# Patient Record
Sex: Female | Born: 1975
Health system: Southern US, Community
[De-identification: ages and names within clinical notes are randomized; demographics above are authoritative.]

## PROBLEM LIST (undated history)

## (undated) DIAGNOSIS — D649 Anemia, unspecified: Secondary | ICD-10-CM

## (undated) DIAGNOSIS — E559 Vitamin D deficiency, unspecified: Secondary | ICD-10-CM

---

## 1997-07-04 ENCOUNTER — Other Ambulatory Visit: Admission: RE | Admit: 1997-07-04 | Discharge: 1997-07-04 | Payer: Self-pay | Admitting: Obstetrics

## 1997-07-10 ENCOUNTER — Ambulatory Visit (HOSPITAL_COMMUNITY): Admission: RE | Admit: 1997-07-10 | Discharge: 1997-07-10 | Payer: Self-pay | Admitting: Obstetrics

## 1997-09-20 ENCOUNTER — Ambulatory Visit (HOSPITAL_COMMUNITY): Admission: RE | Admit: 1997-09-20 | Discharge: 1997-09-20 | Payer: Self-pay | Admitting: Obstetrics

## 1997-11-26 ENCOUNTER — Inpatient Hospital Stay (HOSPITAL_COMMUNITY): Admission: AD | Admit: 1997-11-26 | Discharge: 1997-11-26 | Payer: Self-pay | Admitting: Obstetrics

## 1997-12-07 ENCOUNTER — Inpatient Hospital Stay (HOSPITAL_COMMUNITY): Admission: AD | Admit: 1997-12-07 | Discharge: 1997-12-09 | Payer: Self-pay | Admitting: Obstetrics

## 1998-10-06 ENCOUNTER — Other Ambulatory Visit: Admission: RE | Admit: 1998-10-06 | Discharge: 1998-10-06 | Payer: Self-pay | Admitting: Obstetrics

## 1999-11-24 ENCOUNTER — Other Ambulatory Visit: Admission: RE | Admit: 1999-11-24 | Discharge: 1999-11-24 | Payer: Self-pay | Admitting: Obstetrics & Gynecology

## 1999-11-24 ENCOUNTER — Encounter: Admission: RE | Admit: 1999-11-24 | Discharge: 1999-11-24 | Payer: Self-pay | Admitting: Obstetrics & Gynecology

## 2000-05-19 ENCOUNTER — Encounter: Admission: RE | Admit: 2000-05-19 | Discharge: 2000-05-19 | Payer: Self-pay | Admitting: Obstetrics

## 2000-08-29 ENCOUNTER — Encounter: Admission: RE | Admit: 2000-08-29 | Discharge: 2000-08-29 | Payer: Self-pay | Admitting: Internal Medicine

## 2000-11-22 ENCOUNTER — Other Ambulatory Visit: Admission: RE | Admit: 2000-11-22 | Discharge: 2000-11-22 | Payer: Self-pay | Admitting: Internal Medicine

## 2002-03-27 ENCOUNTER — Other Ambulatory Visit: Admission: RE | Admit: 2002-03-27 | Discharge: 2002-03-27 | Payer: Self-pay | Admitting: Obstetrics and Gynecology

## 2002-12-17 ENCOUNTER — Encounter: Admission: RE | Admit: 2002-12-17 | Discharge: 2002-12-17 | Payer: Self-pay | Admitting: Internal Medicine

## 2003-08-27 ENCOUNTER — Encounter: Admission: RE | Admit: 2003-08-27 | Discharge: 2003-08-27 | Payer: Self-pay | Admitting: Internal Medicine

## 2003-09-09 ENCOUNTER — Encounter: Admission: RE | Admit: 2003-09-09 | Discharge: 2003-09-09 | Payer: Self-pay | Admitting: Internal Medicine

## 2004-03-06 ENCOUNTER — Encounter: Admission: RE | Admit: 2004-03-06 | Discharge: 2004-03-06 | Payer: Self-pay | Admitting: Internal Medicine

## 2006-11-16 ENCOUNTER — Encounter: Admission: RE | Admit: 2006-11-16 | Discharge: 2006-11-16 | Payer: Self-pay | Admitting: Internal Medicine

## 2008-07-01 ENCOUNTER — Encounter: Payer: Self-pay | Admitting: Internal Medicine

## 2008-07-01 ENCOUNTER — Ambulatory Visit: Payer: Self-pay | Admitting: *Deleted

## 2008-07-01 DIAGNOSIS — J029 Acute pharyngitis, unspecified: Secondary | ICD-10-CM | POA: Insufficient documentation

## 2008-09-09 ENCOUNTER — Ambulatory Visit: Payer: Self-pay | Admitting: Internal Medicine

## 2008-09-09 DIAGNOSIS — M549 Dorsalgia, unspecified: Secondary | ICD-10-CM | POA: Insufficient documentation

## 2008-09-09 LAB — CONVERTED CEMR LAB
AST: 14 units/L (ref 0–37)
BUN: 10 mg/dL (ref 6–23)
Basophils Absolute: 0 10*3/uL (ref 0.0–0.1)
Basophils Relative: 0 % (ref 0–1)
Calcium: 8.9 mg/dL (ref 8.4–10.5)
Chloride: 108 meq/L (ref 96–112)
Creatinine, Ser: 0.78 mg/dL (ref 0.40–1.20)
Eosinophils Absolute: 0.2 10*3/uL (ref 0.0–0.7)
Eosinophils Relative: 2 % (ref 0–5)
HCT: 33.2 % — ABNORMAL LOW (ref 36.0–46.0)
Lymphs Abs: 1.7 10*3/uL (ref 0.7–4.0)
MCHC: 32.8 g/dL (ref 30.0–36.0)
MCV: 76.8 fL — ABNORMAL LOW (ref >=78.0)
Nitrite: NEGATIVE
Platelets: 302 10*3/uL (ref 150–400)
Protein, ur: NEGATIVE mg/dL
RDW: 16.7 % — ABNORMAL HIGH (ref 11.5–15.5)
Specific Gravity, Urine: 1.029 (ref 1.005–1.0)
Urobilinogen, UA: 1 (ref 0.0–1.0)

## 2010-08-24 ENCOUNTER — Other Ambulatory Visit: Payer: Self-pay | Admitting: Obstetrics and Gynecology

## 2010-08-24 DIAGNOSIS — N644 Mastodynia: Secondary | ICD-10-CM

## 2010-09-11 ENCOUNTER — Ambulatory Visit
Admission: RE | Admit: 2010-09-11 | Discharge: 2010-09-11 | Disposition: A | Discharge status: Home / Self Care | Payer: 59 | Source: Ambulatory Visit | Attending: Obstetrics and Gynecology | Admitting: Obstetrics and Gynecology

## 2010-09-11 DIAGNOSIS — N644 Mastodynia: Secondary | ICD-10-CM

## 2014-07-12 ENCOUNTER — Other Ambulatory Visit: Payer: Self-pay | Admitting: Obstetrics and Gynecology

## 2014-07-26 ENCOUNTER — Inpatient Hospital Stay (HOSPITAL_COMMUNITY)
Admission: AD | Admit: 2014-07-26 | Discharge: 2014-07-26 | Disposition: A | Discharge status: Home / Self Care | Payer: 59 | Source: Ambulatory Visit | Attending: Obstetrics and Gynecology | Admitting: Obstetrics and Gynecology

## 2014-07-26 ENCOUNTER — Encounter (HOSPITAL_COMMUNITY): Payer: Self-pay | Admitting: *Deleted

## 2014-07-26 DIAGNOSIS — D649 Anemia, unspecified: Secondary | ICD-10-CM | POA: Diagnosis present

## 2014-07-26 DIAGNOSIS — E559 Vitamin D deficiency, unspecified: Secondary | ICD-10-CM | POA: Insufficient documentation

## 2014-07-26 HISTORY — DX: Anemia, unspecified: D64.9

## 2014-07-26 HISTORY — DX: Vitamin D deficiency, unspecified: E55.9

## 2014-07-26 MED ORDER — SODIUM CHLORIDE 0.9 % IV SOLN
510.0000 mg | INTRAVENOUS | Status: DC
  Administered 2014-07-26: 510 mg via INTRAVENOUS
  Filled 2014-07-26: qty 17

## 2014-07-26 NOTE — MAU Note (Signed)
Urine in lab 

## 2014-11-25 ENCOUNTER — Other Ambulatory Visit: Payer: Self-pay | Admitting: Obstetrics and Gynecology

## 2015-04-01 ENCOUNTER — Other Ambulatory Visit: Payer: Self-pay | Admitting: Obstetrics and Gynecology

## 2015-04-04 DIAGNOSIS — D509 Iron deficiency anemia, unspecified: Secondary | ICD-10-CM | POA: Diagnosis not present

## 2015-05-19 ENCOUNTER — Other Ambulatory Visit: Payer: Self-pay | Admitting: Obstetrics and Gynecology

## 2015-08-06 ENCOUNTER — Other Ambulatory Visit: Payer: Self-pay | Admitting: Obstetrics and Gynecology

## 2015-08-06 MED FILL — FLUCONAZOLE 150 MG TABLET: 150 | 1 days supply | Qty: 1 | Fill #0

## 2015-08-25 ENCOUNTER — Inpatient Hospital Stay (HOSPITAL_COMMUNITY)
Admission: AD | Admit: 2015-08-25 | Discharge: 2015-08-25 | Disposition: A | Discharge status: Home / Self Care | Payer: 59 | Source: Ambulatory Visit | Attending: Obstetrics and Gynecology | Admitting: Obstetrics and Gynecology

## 2015-08-25 DIAGNOSIS — D508 Other iron deficiency anemias: Secondary | ICD-10-CM | POA: Insufficient documentation

## 2015-08-25 MED ORDER — SODIUM CHLORIDE 0.9 % IV SOLN
510.0000 mg | Freq: Once | INTRAVENOUS | Status: AC
  Administered 2015-08-25: 510 mg via INTRAVENOUS
  Filled 2015-08-25: qty 17

## 2015-08-25 NOTE — MAU Note (Signed)
No adverse effects to med. 

## 2015-11-24 DIAGNOSIS — Z01419 Encounter for gynecological examination (general) (routine) without abnormal findings: Secondary | ICD-10-CM | POA: Diagnosis not present

## 2015-11-24 DIAGNOSIS — Z1151 Encounter for screening for human papillomavirus (HPV): Secondary | ICD-10-CM | POA: Diagnosis not present

## 2015-11-24 DIAGNOSIS — Z6841 Body Mass Index (BMI) 40.0 and over, adult: Secondary | ICD-10-CM | POA: Diagnosis not present

## 2015-11-24 DIAGNOSIS — Z1231 Encounter for screening mammogram for malignant neoplasm of breast: Secondary | ICD-10-CM | POA: Diagnosis not present

## 2016-08-27 ENCOUNTER — Other Ambulatory Visit: Payer: 59 | Admitting: Internal Medicine

## 2016-08-27 DIAGNOSIS — Z13220 Encounter for screening for lipoid disorders: Secondary | ICD-10-CM | POA: Diagnosis not present

## 2016-08-27 DIAGNOSIS — Z1329 Encounter for screening for other suspected endocrine disorder: Secondary | ICD-10-CM

## 2016-08-27 DIAGNOSIS — Z0000 Encounter for general adult medical examination without abnormal findings: Secondary | ICD-10-CM

## 2016-08-27 DIAGNOSIS — Z130 Encounter for screening for diseases of the blood and blood-forming organs and certain disorders involving the immune mechanism: Secondary | ICD-10-CM | POA: Diagnosis not present

## 2016-08-27 DIAGNOSIS — Z1321 Encounter for screening for nutritional disorder: Secondary | ICD-10-CM

## 2016-08-27 LAB — CBC WITH DIFFERENTIAL/PLATELET
BASOS PCT: 1 %
Basophils Absolute: 48 cells/uL (ref 0–200)
EOS PCT: 4 %
Eosinophils Absolute: 192 cells/uL (ref 15–500)
HEMATOCRIT: 27.6 % — AB (ref 35.0–45.0)
Hemoglobin: 7.4 g/dL — ABNORMAL LOW (ref 11.7–15.5)
LYMPHS PCT: 27 %
Lymphs Abs: 1296 cells/uL (ref 850–3900)
MCH: 17.7 pg — ABNORMAL LOW (ref 27.0–33.0)
MCHC: 26.8 g/dL — AB (ref 32.0–36.0)
MCV: 65.9 fL — ABNORMAL LOW (ref 80.0–100.0)
MONO ABS: 336 {cells}/uL (ref 200–950)
MONOS PCT: 7 %
MPV: 8.8 fL (ref 7.5–12.5)
NEUTROS PCT: 61 %
Neutro Abs: 2928 cells/uL (ref 1500–7800)
PLATELETS: 210 10*3/uL (ref 140–400)
RBC: 4.19 MIL/uL (ref 3.80–5.10)
RDW: 17.7 % — AB (ref 11.0–15.0)
WBC: 4.8 10*3/uL (ref 3.8–10.8)

## 2016-08-27 LAB — LIPID PANEL
CHOL/HDL RATIO: 2.6 ratio (ref <5.0)
CHOLESTEROL: 145 mg/dL (ref <200)
HDL: 55 mg/dL (ref >50)
LDL CALC: 82 mg/dL (ref <100)
Triglycerides: 39 mg/dL (ref <150)
VLDL: 8 mg/dL (ref <30)

## 2016-08-27 LAB — COMPLETE METABOLIC PANEL WITH GFR
ALT: 6 U/L (ref 6–29)
AST: 10 U/L (ref 10–30)
Albumin: 3.9 g/dL (ref 3.6–5.1)
Alkaline Phosphatase: 47 U/L (ref 33–115)
BUN: 11 mg/dL (ref 7–25)
CALCIUM: 8.6 mg/dL (ref 8.6–10.2)
CHLORIDE: 108 mmol/L (ref 98–110)
CO2: 21 mmol/L (ref 20–31)
CREATININE: 0.73 mg/dL (ref 0.50–1.10)
GFR, Est African American: >89 mL/min (ref >=60)
GFR, Est Non African American: >89 mL/min (ref >=60)
GLUCOSE: 83 mg/dL (ref 65–99)
POTASSIUM: 4.1 mmol/L (ref 3.5–5.3)
SODIUM: 137 mmol/L (ref 135–146)
Total Bilirubin: 0.5 mg/dL (ref 0.2–1.2)
Total Protein: 6.2 g/dL (ref 6.1–8.1)

## 2016-08-28 LAB — TSH: TSH: 1.81 mIU/L

## 2016-08-28 LAB — VITAMIN D 25 HYDROXY (VIT D DEFICIENCY, FRACTURES): VIT D 25 HYDROXY: 23 ng/mL — AB (ref 30–100)

## 2016-09-03 ENCOUNTER — Encounter: Payer: Self-pay | Admitting: Internal Medicine

## 2016-09-03 ENCOUNTER — Ambulatory Visit (INDEPENDENT_AMBULATORY_CARE_PROVIDER_SITE_OTHER): Payer: 59 | Admitting: Internal Medicine

## 2016-09-03 VITALS — BP 102/70 | HR 75 | Temp 98.2°F | Ht 66.25 in | Wt 245.0 lb

## 2016-09-03 DIAGNOSIS — D649 Anemia, unspecified: Secondary | ICD-10-CM

## 2016-09-03 DIAGNOSIS — D500 Iron deficiency anemia secondary to blood loss (chronic): Secondary | ICD-10-CM | POA: Diagnosis not present

## 2016-09-03 DIAGNOSIS — Z0000 Encounter for general adult medical examination without abnormal findings: Secondary | ICD-10-CM

## 2016-09-03 LAB — POCT URINALYSIS DIPSTICK
BILIRUBIN UA: NEGATIVE
GLUCOSE UA: NEGATIVE
KETONES UA: NEGATIVE
Leukocytes, UA: NEGATIVE
NITRITE UA: NEGATIVE
PH UA: 5 (ref 5.0–8.0)
Protein, UA: NEGATIVE
Spec Grav, UA: 1.02 (ref 1.010–1.025)
Urobilinogen, UA: 0.2 E.U./dL

## 2016-09-03 LAB — IRON AND TIBC
%SAT: 3 % — ABNORMAL LOW (ref 11–50)
Iron: 14 ug/dL — ABNORMAL LOW (ref 40–190)
TIBC: 425 ug/dL (ref 250–450)
UIBC: 411 ug/dL

## 2016-09-04 DIAGNOSIS — D509 Iron deficiency anemia, unspecified: Secondary | ICD-10-CM | POA: Insufficient documentation

## 2016-09-04 LAB — VITAMIN B12: VITAMIN B 12: 373 pg/mL (ref 200–1100)

## 2016-09-04 NOTE — Progress Notes (Signed)
Subjective:    Patient ID: Lori Lara, female    DOB: May 19, 1975, 41 y.o.   MRN: [id number]  HPI Pleasant 41 year old Black Female presents the office for the first time today for health maintenance exam and evaluation of medical issues.  Patient has a history of iron deficiency anemia. GYN physician is Dr. Garwin Brothers. Patient relates that she has a history of uterine fibroid and menorrhagia.  I do not have GYN records at this time   Ultrasound the pelvis in 2008 ( ten years ago) was negative for uterine fibroids and endometrial at that time appeared to be normal.  All records in Epic indicate she had a hemoglobin of 10.9 g in 2010 with an MCV of 76.8.  She now has a hemoglobin of 7.4 g with an MCV of 65.9. RDW is 17.7. White blood cell count is normal.  Iron level checked after we discovered she was anemic is 14. B-12 level is 373. She is vitamin D deficient at 22. Lipid panel and TSH are normal and C met is normal.  Records will try an internal medicine in 2002 indicate she had a normal hemoglobin of 12.8 g. In 2005 she had a hemoglobin of 11.5 g with an MCV of 84.3.  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 She has a high school education and is single. Does not smoke or consume alcohol. She has 2 sons ages 43 and 68.  Family history: Father age 60 in good health. Mother age 7 with history of hypertension and rheumatoid arthritis. One sister 74 in good health. Maternal grandfather with history of MI. Paternal grandmother with history of dementia.  History of constipation and internal hemorrhoids treated with MiraLAX and Anusol in 2014. At that time was on oral iron supplement. Treated for PID in 2011. History of cesarean section.  No known drug allergies.   Review of Systems she has a prior history of headaches and hair loss. Last pelvic exam October 2017. Duration of menses is 5-7 days and interval is 23-25  days. Says Dr. Garwin Brothers has discussed options for managing uterine fibroid and menorrhagia with her previously. Has mammogram through GYN office     Objective:   Physical Exam  Constitutional: She is oriented to person, place, and time. She appears well-developed and well-nourished. No distress.  HENT:  Head: Normocephalic and atraumatic.  Right Ear: External ear normal.  Left Ear: External ear normal.  Mouth/Throat: Oropharynx is clear and moist. No oropharyngeal exudate.  Eyes: Pupils are equal, round, and reactive to light. Conjunctivae and EOM are normal. Left eye exhibits no discharge. No scleral icterus.  Neck: Neck supple. No JVD present. No thyromegaly present.  Cardiovascular: Normal rate, regular rhythm, normal heart sounds and intact distal pulses.   No murmur heard. Pulmonary/Chest: No respiratory distress. She has no wheezes. She has no rales.  Breasts normal female without masses  Abdominal: Soft. Bowel sounds are normal. She exhibits no distension and no mass. There is no tenderness. There is no rebound.  Genitourinary:  Genitourinary Comments: Deferred to GYN  Musculoskeletal: She exhibits no edema.  Lymphadenopathy:    She has no cervical adenopathy.  Neurological: She is alert and oriented to person, place, and time. She has normal reflexes. She displays normal reflexes. No cranial nerve deficit.  Skin: Skin is warm and dry. No rash noted. She is not diaphoretic.  Psychiatric: She has a normal mood and affect. Her behavior is  normal. Judgment and thought content normal.  Vitals reviewed.         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 Phillip Heal for 10 in the near future for consideration of IV iron therapy since she became constipated on oral iron previously and has long-standing history of iron deficiency. She may need to give consideration to options  Dr. Garwin Brothers has discussed with her for management of menorrhagia.  Start oral iron and return in 2 weeks for CBC and reticulocyte count as well as iron and iron-binding capacity.

## 2016-09-04 NOTE — Patient Instructions (Addendum)
It was a pleasure to see you today. We will refer you to hematologist for consideration of IV iron therapy for chronic iron deficiency anemia. Start oral iron and return in 2 weeks for reticulocyte count and repeat CBC.

## 2016-09-15 ENCOUNTER — Encounter: Payer: Self-pay | Admitting: Internal Medicine

## 2016-09-16 ENCOUNTER — Other Ambulatory Visit: Payer: 59 | Admitting: Internal Medicine

## 2016-09-17 ENCOUNTER — Ambulatory Visit: Payer: 59 | Admitting: Internal Medicine

## 2016-09-24 ENCOUNTER — Other Ambulatory Visit: Payer: 59 | Admitting: Internal Medicine

## 2016-09-24 DIAGNOSIS — D509 Iron deficiency anemia, unspecified: Secondary | ICD-10-CM

## 2016-09-24 LAB — CBC WITH DIFFERENTIAL/PLATELET
BASOS PCT: 1 %
Basophils Absolute: 49 cells/uL (ref 0–200)
EOS PCT: 4 %
Eosinophils Absolute: 196 cells/uL (ref 15–500)
HEMATOCRIT: 28.2 % — AB (ref 35.0–45.0)
HEMOGLOBIN: 7.8 g/dL — AB (ref 11.7–15.5)
LYMPHS ABS: 1274 {cells}/uL (ref 850–3900)
Lymphocytes Relative: 26 %
MCH: 19.3 pg — ABNORMAL LOW (ref 27.0–33.0)
MCHC: 27.7 g/dL — AB (ref 32.0–36.0)
MCV: 69.6 fL — ABNORMAL LOW (ref 80.0–100.0)
MONO ABS: 294 {cells}/uL (ref 200–950)
MPV: 9.1 fL (ref 7.5–12.5)
Monocytes Relative: 6 %
NEUTROS ABS: 3087 {cells}/uL (ref 1500–7800)
Neutrophils Relative %: 63 %
PLATELETS: 386 10*3/uL (ref 140–400)
RBC: 4.05 MIL/uL (ref 3.80–5.10)
RDW: 22.4 % — AB (ref 11.0–15.0)
WBC: 4.9 10*3/uL (ref 3.8–10.8)

## 2016-09-25 LAB — IRON AND TIBC
%SAT: 44 % (ref 11–50)
Iron: 173 ug/dL (ref 40–190)
TIBC: 392 ug/dL (ref 250–450)
UIBC: 219 ug/dL

## 2016-09-27 ENCOUNTER — Ambulatory Visit (INDEPENDENT_AMBULATORY_CARE_PROVIDER_SITE_OTHER): Payer: 59 | Admitting: Internal Medicine

## 2016-09-27 ENCOUNTER — Encounter: Payer: Self-pay | Admitting: Internal Medicine

## 2016-09-27 VITALS — BP 100/80 | HR 68 | Temp 98.1°F | Ht 66.25 in | Wt 247.0 lb

## 2016-09-27 DIAGNOSIS — D649 Anemia, unspecified: Secondary | ICD-10-CM

## 2016-09-27 LAB — RETICULOCYTES
ABS RETIC: 60450 {cells}/uL (ref 20000–80000)
RBC.: 4.03 MIL/uL (ref 3.80–5.10)
Retic Ct Pct: 1.5 %

## 2016-09-27 NOTE — Patient Instructions (Signed)
Additional anemia studies drawn and pending. Please keep appointment with hematologist in September. Take iron once daily and may take MiraLAX for constipation.

## 2016-09-27 NOTE — Progress Notes (Signed)
   Subjective:    Patient ID: Lori Lara, female    DOB: 1975/09/10, 41 y.o.   MRN: [id number]  HPI 41 year old Black Female for follow up of anemia.    Periods are heavy day 2 or 3  of cycle but does not consider it to be excessively heavy.   She was seen here for the first time July 27. Hemoglobin at that time was 7.4 g with an MCV of 65.9. The last CBC on file in Epic was 8 years ago and hemoglobin was 10.9 g with an MCV of 76.8.  After iron therapy for about a month, hemoglobin is still low at 7.8 g and MCV is 69.6. I would expect better improvement with iron therapy. Platelet counts are normal. White blood cell counts are normal.  Perhaps she has thalassemia.   Started out taking iron sulfate twice daily but caused constipation. Now just taking once daily. May take MiraLAX for constipation associated with iron therapy.    Review of Systems see above  She has appointment with Dr. Beryle Beams in September     Objective:   Physical Exam  Not examined. Spent 15 minutes speaking with her about these issues.      Assessment & Plan:  Iron deficiency is corrected with oral iron therapy having improved from 14 to 173  Persistent anemia and microcytosis? Thalassemia  Her reticulocyte count is 1.5%. B-12 and folate are also being checked. Asked patient to please keep appointment with Dr. Beryle Beams. May continue take iron once daily for the present time.

## 2016-09-28 LAB — VITAMIN B12: Vitamin B-12: 365 pg/mL (ref 200–1100)

## 2016-09-28 LAB — FERRITIN: FERRITIN: 5 ng/mL — AB (ref 10–232)

## 2016-09-28 LAB — FOLATE: Folate: 7.7 ng/mL (ref >5.4)

## 2016-10-25 ENCOUNTER — Ambulatory Visit (INDEPENDENT_AMBULATORY_CARE_PROVIDER_SITE_OTHER): Payer: 59 | Admitting: Oncology

## 2016-10-25 ENCOUNTER — Encounter: Payer: Self-pay | Admitting: Oncology

## 2016-10-25 ENCOUNTER — Encounter: Payer: 59 | Admitting: Oncology

## 2016-10-25 VITALS — BP 135/83 | HR 72 | Temp 97.9°F | Ht 66.0 in | Wt 249.3 lb

## 2016-10-25 DIAGNOSIS — D509 Iron deficiency anemia, unspecified: Secondary | ICD-10-CM | POA: Diagnosis not present

## 2016-10-25 LAB — SAVE SMEAR

## 2016-10-25 MED ORDER — POLYSACCHARIDE IRON COMPLEX 150 MG PO CAPS: 150.0000 mg | ORAL_CAPSULE | Freq: Every day | ORAL | 3 refills | Status: DC

## 2016-10-25 NOTE — Patient Instructions (Addendum)
Change iron preparation to Niferex 150 mg 1 capsule daily To lab today Come back for repeat lab in 2 months on 11/19

## 2016-10-25 NOTE — Progress Notes (Signed)
New Patient Hematology   Lori Lara [id number] Sep 07, 1975 41 y.o. 10/25/2016  CC: Dr. 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 She has long-standing anemia related to menorrhagia and uterine fibroids. She has received iron infusions in the past in June 2016 and again in July 2017 through her OB/GYN's office. She did not take any oral iron supplements in between or since. She recently established her primary care with Dr. Renold Genta. Routine lab done on 08/27/2016 with hemoglobin 7.4, hematocrit 28, MCV 66, white count 4800, 61% neutrophils, 27 lymphocytes, 7 monocytes, 4 eosinophils, 1 basophil. Normal chemistry profile. Iron studies done on July 27 with serum are 14, TIBC 425, ferritin not done at that time. She has been on iron sulfate 325 mg twice daily. Labs were repeated on August 17. Serum iron 173, percent saturation 44, ferritin done 3 days later was 5. She started taking the iron at the time of the July 27 visit initially twice daily but due to significant constipation she decrease to once a day and hasn't taken any for the last week. She is under ongoing evaluation for the menorrhagia associated with her fibroids and a endometrial ablation procedure has been recommended. She has never had any hematochezia or hematuria. Stools are predictably dark on the iron. She has no constitutional symptoms except for fatigue and lightheadedness which have improved since she started taking the iron. She has no history of peptic ulcer disease or reflux. She does not use alcohol. She rarely uses nonsteroidals. She has no other medical or surgical illness. She is on no chronic medications except for the iron. No allergies. She had a C-section with her first child who is now 62. Second child by vaginal delivery who is now 56. Both boys. She has a 3 year old sister who also had severe anemia with a hemoglobin  down to 4 requiring transfusion. Her mother had lifelong anemia and had to have a hysterectomy. She is now 32. Father is 53 and healthy.   PMH: Past Medical History:  Diagnosis Date  . Anemia   . Vitamin D deficiency      past surgical historyC-section with first child:  Allergies: No Known Allergies  Medications:  Current Outpatient Prescriptions:  .  ibuprofen (ADVIL,MOTRIN) 200 MG tablet, Take 600 mg by mouth every 6 (six) hours as needed for moderate pain., Disp: , Rfl:  .  iron polysaccharides (NIFEREX) 150 MG capsule, Take 1 capsule (150 mg total) by mouth daily., Disp: 90 capsule, Rfl: 3  Social History: Married. 2 healthy sons. tshe has never smoked. . She does not drink alcohol or use drugs.  Family History: See history of present illness  Review of Systems: See HPI Remaining ROS negative.  Physical Exam: Blood pressure 135/83, pulse 72, temperature 97.9 F (36.6 C), height 5\' 6"  (1.676 m), weight 249 lb 4.8 oz (113.1 kg), SpO2 100 %. Wt Readings from Last 3 Encounters:  10/25/16 249 lb 4.8 oz (113.1 kg)  09/27/16 247 lb (112 kg)  09/03/16 245 lb (111.1 kg)     General appearance: Well-nourished African-American woman HENNT: Pharynx no erythema, exudate, mass, or ulcer. No thyromegaly or thyroid nodules Lymph nodes: No cervical, supraclavicular, or axillary lymphadenopathy Breasts:  Lungs: Clear to auscultation, resonant to percussion throughout Heart: Regular rhythm, no murmur, no gallop, no rub, no click, no edema Abdomen: Soft, nontender, normal bowel sounds, no mass, no organomegaly Extremities: No  edema, no calf tenderness Musculoskeletal: no joint deformities GU:  Vascular: Carotid pulses 2+,  Neurologic: Alert, oriented, PERRLA, optic discs sharp and vessels normal, no hemorrhage or exudate, cranial nerves grossly normal, motor strength 5 over 5, reflexes 1+ symmetric, upper body coordination normal, gait normal, Skin: No rash or  ecchymosis    Lab Results: Lab Results  Component Value Date   WBC 4.9 09/24/2016   HGB 7.8 (L) 09/24/2016   HCT 28.2 (L) 09/24/2016   MCV 69.6 (L) 09/24/2016   PLT 386 09/24/2016     Chemistry      Component Value Date/Time   NA 137 08/27/2016 1052   K 4.1 08/27/2016 1052   CL 108 08/27/2016 1052   CO2 21 08/27/2016 1052   BUN 11 08/27/2016 1052   CREATININE 0.73 08/27/2016 1052      Component Value Date/Time   CALCIUM 8.6 08/27/2016 1052   ALKPHOS 47 08/27/2016 1052   AST 10 08/27/2016 1052   ALT 6 08/27/2016 1052   BILITOT 0.5 08/27/2016 1052    Hemoglobin today: 8.5 MCV 70 We take count 1.5% on August 20, absolute 60,000 (20,000-80,000)  Review of peripheral blood film:  Hypochromic microcytic red cells.  1+ aniso poikilocytosis.  Subpopulation of elliptocytes.  No polychromasia.  Mature neutrophils and lymphocytes.  Platelets normal in number and morphology with some large platelets.   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  This can be a nonspecific finding with iron deficiency.  It would be difficult to exclude mild hereditary elliptocytosis without further studies of membrane proteins (spectrin).  There appears to be no sign of a coexisting hemolytic process with bilirubin 0.5 and reticulocyte count 1.5%. I think she was just not on the oral iron long enough to replete bone marrow stores. I'm going to change her to Niferex 150 mg ferrous gluconate preparation which may be better tolerated with respect to the constipation she was experiencing. I will check a CBC today for a starting point but not repeat another CBC for 2 months. She is considering an endometrial ablation procedure and this might help significantly with respect to chronic blood loss.    Recommendation: See above   Lori Hopper, MD, [place]  Hematology-Oncology/Internal Medicine  10/25/2016, 2:59 PM

## 2016-10-26 LAB — CBC WITH DIFFERENTIAL/PLATELET
Basophils Absolute: 0.1 10*3/uL (ref 0.0–0.2)
Basos: 1 %
EOS (ABSOLUTE): 0.1 10*3/uL (ref 0.0–0.4)
EOS: 2 %
HEMATOCRIT: 28.9 % — AB (ref 34.0–46.6)
HEMOGLOBIN: 8.5 g/dL — AB (ref 11.1–15.9)
Immature Grans (Abs): 0 10*3/uL (ref 0.0–0.1)
Immature Granulocytes: 0 %
LYMPHS ABS: 1.4 10*3/uL (ref 0.7–3.1)
Lymphs: 28 %
MCH: 20.5 pg — ABNORMAL LOW (ref 26.6–33.0)
MCHC: 29.4 g/dL — AB (ref 31.5–35.7)
MCV: 70 fL — ABNORMAL LOW (ref 79–97)
MONOCYTES: 7 %
MONOS ABS: 0.3 10*3/uL (ref 0.1–0.9)
NEUTROS ABS: 3.2 10*3/uL (ref 1.4–7.0)
Neutrophils: 62 %
PLATELETS: 417 10*3/uL — AB (ref 150–379)
RBC: 4.15 x10E6/uL (ref 3.77–5.28)
RDW: 20.7 % — AB (ref 12.3–15.4)
WBC: 5.1 10*3/uL (ref 3.4–10.8)

## 2016-10-26 MED FILL — POLY-IRON 150 MG CAPSULE: 150 | 60 days supply | Qty: 120 | Fill #0

## 2016-10-28 NOTE — Progress Notes (Signed)
Thanks so much. 

## 2016-12-27 ENCOUNTER — Other Ambulatory Visit (INDEPENDENT_AMBULATORY_CARE_PROVIDER_SITE_OTHER): Payer: 59

## 2016-12-27 DIAGNOSIS — D509 Iron deficiency anemia, unspecified: Secondary | ICD-10-CM

## 2016-12-28 ENCOUNTER — Other Ambulatory Visit: Payer: Self-pay | Admitting: Oncology

## 2016-12-28 ENCOUNTER — Telehealth: Payer: Self-pay | Admitting: *Deleted

## 2016-12-28 DIAGNOSIS — K909 Intestinal malabsorption, unspecified: Secondary | ICD-10-CM

## 2016-12-28 DIAGNOSIS — D508 Other iron deficiency anemias: Secondary | ICD-10-CM

## 2016-12-28 LAB — CBC WITH DIFFERENTIAL/PLATELET
BASOS: 1 %
Basophils Absolute: 0.1 10*3/uL (ref 0.0–0.2)
EOS (ABSOLUTE): 0.1 10*3/uL (ref 0.0–0.4)
EOS: 2 %
HEMATOCRIT: 32.1 % — AB (ref 34.0–46.6)
HEMOGLOBIN: 9.4 g/dL — AB (ref 11.1–15.9)
Immature Grans (Abs): 0 10*3/uL (ref 0.0–0.1)
Immature Granulocytes: 0 %
LYMPHS ABS: 2 10*3/uL (ref 0.7–3.1)
Lymphs: 29 %
MCH: 20.8 pg — ABNORMAL LOW (ref 26.6–33.0)
MCHC: 29.3 g/dL — AB (ref 31.5–35.7)
MCV: 71 fL — AB (ref 79–97)
MONOCYTES: 6 %
MONOS ABS: 0.4 10*3/uL (ref 0.1–0.9)
NEUTROS ABS: 4.2 10*3/uL (ref 1.4–7.0)
NEUTROS PCT: 62 %
Platelets: 305 10*3/uL (ref 150–379)
RBC: 4.52 x10E6/uL (ref 3.77–5.28)
RDW: 17.4 % — AB (ref 12.3–15.4)
WBC: 6.8 10*3/uL (ref 3.4–10.8)

## 2016-12-28 LAB — FERRITIN: Ferritin: 5 ng/mL — ABNORMAL LOW (ref 15–150)

## 2016-12-28 NOTE — Telephone Encounter (Signed)
Changed to 2PM on 12/7 per pt's request.

## 2016-12-28 NOTE — Telephone Encounter (Signed)
-----   Message from [name], MD sent at 12/28/2016  1:44 PM EST ----- Lori Lara - I put in orders to start IV iron on Thurs 11/29. Weekly x 2. Please get her an appt & let her know. Thx DrG

## 2016-12-28 NOTE — Telephone Encounter (Signed)
thanks

## 2016-12-28 NOTE — Telephone Encounter (Signed)
Pt stated she wanted Friday morning appt. Appt scheduled 12/7 @ 0800 AM here at [place]; informed to register at [facility] and schedule next appt before she leaves w/[facility]. Voiced understanding.

## 2017-01-14 ENCOUNTER — Ambulatory Visit (HOSPITAL_COMMUNITY)
Admission: RE | Admit: 2017-01-14 | Discharge: 2017-01-14 | Disposition: A | Discharge status: Home / Self Care | Payer: 59 | Source: Ambulatory Visit | Attending: Oncology | Admitting: Oncology

## 2017-01-14 DIAGNOSIS — K909 Intestinal malabsorption, unspecified: Secondary | ICD-10-CM | POA: Insufficient documentation

## 2017-01-14 DIAGNOSIS — D508 Other iron deficiency anemias: Secondary | ICD-10-CM | POA: Insufficient documentation

## 2017-01-14 MED ORDER — FERUMOXYTOL INJECTION 510 MG/17 ML
510.0000 mg | INTRAVENOUS | Status: DC
  Administered 2017-01-14: 510 mg via INTRAVENOUS
  Filled 2017-01-14: qty 17

## 2017-01-21 ENCOUNTER — Ambulatory Visit (HOSPITAL_COMMUNITY)
Admission: RE | Admit: 2017-01-21 | Discharge: 2017-01-21 | Disposition: A | Discharge status: Home / Self Care | Payer: 59 | Source: Ambulatory Visit | Attending: Oncology | Admitting: Oncology

## 2017-01-21 DIAGNOSIS — K909 Intestinal malabsorption, unspecified: Secondary | ICD-10-CM | POA: Diagnosis not present

## 2017-01-21 DIAGNOSIS — D508 Other iron deficiency anemias: Secondary | ICD-10-CM | POA: Insufficient documentation

## 2017-01-21 MED ORDER — SODIUM CHLORIDE 0.9 % IV SOLN
510.0000 mg | INTRAVENOUS | Status: AC
  Administered 2017-01-21: 08:00:00 510 mg via INTRAVENOUS
  Filled 2017-01-21: qty 17

## 2017-02-15 ENCOUNTER — Encounter: Payer: Self-pay | Admitting: Internal Medicine

## 2017-02-15 DIAGNOSIS — Z1151 Encounter for screening for human papillomavirus (HPV): Secondary | ICD-10-CM | POA: Diagnosis not present

## 2017-02-15 DIAGNOSIS — Z01419 Encounter for gynecological examination (general) (routine) without abnormal findings: Secondary | ICD-10-CM | POA: Diagnosis not present

## 2017-02-15 DIAGNOSIS — Z6841 Body Mass Index (BMI) 40.0 and over, adult: Secondary | ICD-10-CM | POA: Diagnosis not present

## 2017-02-15 DIAGNOSIS — R87810 Cervical high risk human papillomavirus (HPV) DNA test positive: Secondary | ICD-10-CM | POA: Diagnosis not present

## 2017-02-15 DIAGNOSIS — Z1231 Encounter for screening mammogram for malignant neoplasm of breast: Secondary | ICD-10-CM | POA: Diagnosis not present

## 2018-04-07 DIAGNOSIS — Z01419 Encounter for gynecological examination (general) (routine) without abnormal findings: Secondary | ICD-10-CM | POA: Diagnosis not present

## 2018-04-07 DIAGNOSIS — R87810 Cervical high risk human papillomavirus (HPV) DNA test positive: Secondary | ICD-10-CM | POA: Diagnosis not present

## 2018-04-07 DIAGNOSIS — Z6841 Body Mass Index (BMI) 40.0 and over, adult: Secondary | ICD-10-CM | POA: Diagnosis not present

## 2018-04-07 DIAGNOSIS — Z1231 Encounter for screening mammogram for malignant neoplasm of breast: Secondary | ICD-10-CM | POA: Diagnosis not present

## 2018-04-07 DIAGNOSIS — Z1151 Encounter for screening for human papillomavirus (HPV): Secondary | ICD-10-CM | POA: Diagnosis not present

## 2018-09-07 ENCOUNTER — Encounter: Payer: Self-pay | Admitting: Internal Medicine

## 2018-09-07 ENCOUNTER — Ambulatory Visit: Payer: 59 | Admitting: Internal Medicine

## 2018-09-07 ENCOUNTER — Other Ambulatory Visit: Payer: Self-pay

## 2018-09-07 VITALS — BP 110/80 | HR 65 | Temp 97.9°F | Ht 66.0 in | Wt 256.0 lb

## 2018-09-07 DIAGNOSIS — D500 Iron deficiency anemia secondary to blood loss (chronic): Secondary | ICD-10-CM

## 2018-09-07 DIAGNOSIS — D509 Iron deficiency anemia, unspecified: Secondary | ICD-10-CM | POA: Diagnosis not present

## 2018-09-07 DIAGNOSIS — Z8742 Personal history of other diseases of the female genital tract: Secondary | ICD-10-CM | POA: Diagnosis not present

## 2018-09-07 NOTE — Progress Notes (Signed)
   Subjective:    Patient ID: Lori Lara, female    DOB: 11-07-1975, 43 y.o.   MRN: [id number]  HPI Pleasant 43 year old Female with history of iron deficiency anemia treated with Dr. Beryle Beams in 2018.  She initially presented to this office July 2018.  Patient says she has history of uterine fibroid and menorrhagia.  In 2018 had hemoglobin of 7.4 g with an MCV of 65.9.  White blood cell count was normal.  Ferritin was 5.  Iron level was 14 and iron binding capacity was 425.  Right oral iron supplementation as it caused significant constipation.  She was subsequently referred to Dr. Beryle Beams in September 2018.  Was given Feraheme in December 2018.  Has not been taking iron supplementation regularly and is beginning to feel like she is anemic again.  CBC reveals hemoglobin 10.4 g with an MCV of 79.1.  Iron level is 17 with iron binding capacity of 383 and ferritin of 5.  Review of Systems     Objective:   Physical Exam Blood pressure 110/80, weight 256 pounds, BMI 41.32, pulse 65, respiratory rate normal, pulse oximetry 95%.  Neck is supple without adenopathy.  Chest clear to auscultation.  Cardiac exam regular rate and rhythm normal S1 and S2.  Extremities without pitting edema.       Assessment & Plan:  Iron deficiency anemia secondary to menorrhagia and fibroid  Intolerance of oral iron supplementation  Plan: Refer back to hematology for consideration of IV iron treatment.  She will have physical exam here in the near future.

## 2018-09-08 ENCOUNTER — Other Ambulatory Visit: Payer: Self-pay | Admitting: Internal Medicine

## 2018-09-08 DIAGNOSIS — E611 Iron deficiency: Secondary | ICD-10-CM

## 2018-09-08 LAB — CBC WITH DIFFERENTIAL/PLATELET
Absolute Monocytes: 476 cells/uL (ref 200–950)
Basophils Absolute: 80 cells/uL (ref 0–200)
Basophils Relative: 1.2 %
Eosinophils Absolute: 114 cells/uL (ref 15–500)
Eosinophils Relative: 1.7 %
HCT: 33.6 % — ABNORMAL LOW (ref 35.0–45.0)
Hemoglobin: 10.4 g/dL — ABNORMAL LOW (ref 11.7–15.5)
Lymphs Abs: 2044 cells/uL (ref 850–3900)
MCH: 24.5 pg — ABNORMAL LOW (ref 27.0–33.0)
MCHC: 31 g/dL — ABNORMAL LOW (ref 32.0–36.0)
MCV: 79.1 fL — ABNORMAL LOW (ref 80.0–100.0)
MPV: 11.3 fL (ref 7.5–12.5)
Monocytes Relative: 7.1 %
Neutro Abs: 3987 cells/uL (ref 1500–7800)
Neutrophils Relative %: 59.5 %
Platelets: 329 10*3/uL (ref 140–400)
RBC: 4.25 10*6/uL (ref 3.80–5.10)
RDW: 15.1 % — ABNORMAL HIGH (ref 11.0–15.0)
Total Lymphocyte: 30.5 %
WBC: 6.7 10*3/uL (ref 3.8–10.8)

## 2018-09-08 LAB — IRON,TIBC AND FERRITIN PANEL
%SAT: 4 % (calc) — ABNORMAL LOW (ref 16–45)
Ferritin: 5 ng/mL — ABNORMAL LOW (ref 16–232)
Iron: 17 ug/dL — ABNORMAL LOW (ref 40–190)
TIBC: 383 mcg/dL (calc) (ref 250–450)

## 2018-09-08 NOTE — Patient Instructions (Signed)
You are indeed iron deficient once again and will be referred to Hematology for consideration of IV iron treatment

## 2018-09-25 ENCOUNTER — Telehealth: Payer: Self-pay | Admitting: Oncology

## 2018-09-25 NOTE — Telephone Encounter (Signed)
Received anew hem referral from Dr. Renold Genta for Lori Lara. Lori Lara has been cld and scheduled to see Dr. Alen Blew on 9/18 at 2pm.

## 2018-10-12 ENCOUNTER — Other Ambulatory Visit: Payer: Self-pay

## 2018-10-12 ENCOUNTER — Other Ambulatory Visit: Payer: 59 | Admitting: Internal Medicine

## 2018-10-12 DIAGNOSIS — Z1329 Encounter for screening for other suspected endocrine disorder: Secondary | ICD-10-CM

## 2018-10-12 DIAGNOSIS — E611 Iron deficiency: Secondary | ICD-10-CM | POA: Diagnosis not present

## 2018-10-12 DIAGNOSIS — E559 Vitamin D deficiency, unspecified: Secondary | ICD-10-CM | POA: Diagnosis not present

## 2018-10-12 DIAGNOSIS — Z13220 Encounter for screening for lipoid disorders: Secondary | ICD-10-CM | POA: Diagnosis not present

## 2018-10-12 DIAGNOSIS — Z0000 Encounter for general adult medical examination without abnormal findings: Secondary | ICD-10-CM

## 2018-10-13 LAB — CBC WITH DIFFERENTIAL/PLATELET
Absolute Monocytes: 502 cells/uL (ref 200–950)
Basophils Absolute: 73 cells/uL (ref 0–200)
Basophils Relative: 1.1 %
Eosinophils Absolute: 132 cells/uL (ref 15–500)
Eosinophils Relative: 2 %
HCT: 33.6 % — ABNORMAL LOW (ref 35.0–45.0)
Hemoglobin: 10.2 g/dL — ABNORMAL LOW (ref 11.7–15.5)
Lymphs Abs: 1940 cells/uL (ref 850–3900)
MCH: 24 pg — ABNORMAL LOW (ref 27.0–33.0)
MCHC: 30.4 g/dL — ABNORMAL LOW (ref 32.0–36.0)
MCV: 79.1 fL — ABNORMAL LOW (ref 80.0–100.0)
MPV: 10.4 fL (ref 7.5–12.5)
Monocytes Relative: 7.6 %
Neutro Abs: 3953 cells/uL (ref 1500–7800)
Neutrophils Relative %: 59.9 %
Platelets: 384 10*3/uL (ref 140–400)
RBC: 4.25 10*6/uL (ref 3.80–5.10)
RDW: 14.5 % (ref 11.0–15.0)
Total Lymphocyte: 29.4 %
WBC: 6.6 10*3/uL (ref 3.8–10.8)

## 2018-10-13 LAB — LIPID PANEL
Cholesterol: 171 mg/dL (ref <200)
HDL: 62 mg/dL (ref >=50)
LDL Cholesterol (Calc): 95 mg/dL (calc)
Non-HDL Cholesterol (Calc): 109 mg/dL (calc) (ref <130)
Total CHOL/HDL Ratio: 2.8 (calc) (ref <5.0)
Triglycerides: 57 mg/dL (ref <150)

## 2018-10-13 LAB — COMPLETE METABOLIC PANEL WITH GFR
AG Ratio: 2 (calc) (ref 1.0–2.5)
ALT: 10 U/L (ref 6–29)
AST: 14 U/L (ref 10–30)
Albumin: 4.3 g/dL (ref 3.6–5.1)
Alkaline phosphatase (APISO): 49 U/L (ref 31–125)
BUN: 10 mg/dL (ref 7–25)
CO2: 24 mmol/L (ref 20–32)
Calcium: 9.2 mg/dL (ref 8.6–10.2)
Chloride: 105 mmol/L (ref 98–110)
Creat: 0.79 mg/dL (ref 0.50–1.10)
GFR, Est African American: 106 mL/min/{1.73_m2} (ref >=60)
GFR, Est Non African American: 92 mL/min/{1.73_m2} (ref >=60)
Globulin: 2.2 g/dL (calc) (ref 1.9–3.7)
Glucose, Bld: 75 mg/dL (ref 65–99)
Potassium: 4.2 mmol/L (ref 3.5–5.3)
Sodium: 138 mmol/L (ref 135–146)
Total Bilirubin: 0.6 mg/dL (ref 0.2–1.2)
Total Protein: 6.5 g/dL (ref 6.1–8.1)

## 2018-10-13 LAB — VITAMIN D 25 HYDROXY (VIT D DEFICIENCY, FRACTURES): Vit D, 25-Hydroxy: 17 ng/mL — ABNORMAL LOW (ref 30–100)

## 2018-10-13 LAB — TSH: TSH: 1.97 mIU/L

## 2018-10-18 ENCOUNTER — Encounter: Payer: Self-pay | Admitting: Internal Medicine

## 2018-10-18 ENCOUNTER — Other Ambulatory Visit: Payer: Self-pay

## 2018-10-18 ENCOUNTER — Ambulatory Visit (INDEPENDENT_AMBULATORY_CARE_PROVIDER_SITE_OTHER): Payer: 59 | Admitting: Internal Medicine

## 2018-10-18 VITALS — BP 102/70 | HR 86 | Temp 98.0°F | Ht 66.0 in | Wt 258.0 lb

## 2018-10-18 DIAGNOSIS — Z0000 Encounter for general adult medical examination without abnormal findings: Secondary | ICD-10-CM

## 2018-10-18 DIAGNOSIS — D500 Iron deficiency anemia secondary to blood loss (chronic): Secondary | ICD-10-CM | POA: Diagnosis not present

## 2018-10-18 DIAGNOSIS — E559 Vitamin D deficiency, unspecified: Secondary | ICD-10-CM

## 2018-10-18 DIAGNOSIS — Z8742 Personal history of other diseases of the female genital tract: Secondary | ICD-10-CM | POA: Diagnosis not present

## 2018-10-18 LAB — POCT URINALYSIS DIPSTICK
Appearance: NEGATIVE
Bilirubin, UA: NEGATIVE
Blood, UA: NEGATIVE
Glucose, UA: NEGATIVE
Ketones, UA: NEGATIVE
Leukocytes, UA: NEGATIVE
Odor: NEGATIVE
Protein, UA: NEGATIVE
Spec Grav, UA: 1.01 (ref 1.010–1.025)
Urobilinogen, UA: 0.2 E.U./dL
pH, UA: 6.5 (ref 5.0–8.0)

## 2018-10-18 MED ORDER — ERGOCALCIFEROL 1.25 MG (50000 UT) PO CAPS: 50000.0000 [IU] | ORAL_CAPSULE | ORAL | 3 refills | Status: DC

## 2018-10-18 MED FILL — VIT D2 1.25 MG (50,000 UNIT: 1.25 MG | 84 days supply | Qty: 12 | Fill #0

## 2018-10-18 NOTE — Patient Instructions (Addendum)
It was a pleasure to see you today. Continue oral iron and see Hematologist about IV iron infusion. I think hysterectomy is a good plan in the near future. Take Drisdol 50,000 units weekly. Have flu vaccine at work and call employee health to check on your tetanus immunization date.  Have vitamin D level checked here in 6 months otherwise return in 1 year or as needed.

## 2018-10-18 NOTE — Progress Notes (Signed)
   Subjective:    Patient ID: Lori Lara, female    DOB: 07/24/1975, 43 y.o.   MRN: [id number]  HPI 43 year old Female for health maintenance exam and evaluation of medical issues.  Initially seen for the first time July 2018.  History of iron deficiency anemia.  Previously saw Dr. Beryle Beams in 2018 for iron deficiency anemia not responding to oral iron and received to be iron injections.  Now his hemoglobin 10.4 g with MCV 79.1.  Total iron is 17 and ferritin is 5.  TSH and lipid panel are both normal.  Vitamin D level is low at 17 and she will be treated with high-dose vitamin D.  Records at [facility] in 2002 indicated she had a normal hemoglobin of 12.8 g.  In 2005 she had a hemoglobin of 11.5 g with an MCV of 84.3.  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  She has a high school education and is single.  Does not smoke or consume alcohol.  She has 2 sons ages 85 and 58.  She has history of constipation and internal hemorrhoids treated with MiraLAX and Anusol in 2014.  At that time was on oral iron supplement.  Treated for PID in 2011.  History of Cesarean section.  No known drug allergies.  Family history: Father age 28 in good health.  Mother age 25 with history of hypertension and rheumatoid arthritis.  One sister age 55 in good health.  Maternal grandfather with history of MI.  Paternal grandmother with history of dementia.         Review of Systems  Constitutional: Positive for fatigue.  Respiratory: Negative.   Cardiovascular: Negative.   Genitourinary:       History of menorrhagia  Psychiatric/Behavioral: Negative.   -- menorrhagia followed by Dr. Garwin Brothers. Considering hysterectomy after pandemic under control. Does not take Vitamin D supplement.     Objective:   Physical Exam Pleasant female in no acute distress blood pressure 102/70 pulse 86 regular temperature 98  degrees orally pulse oximetry 97% weight 258 pounds BMI 41.64 height 5 feet 6 inches  Skin warm and dry.  Nodes none.  Neck is supple without JVD thyromegaly or carotid bruits.  TMs and pharynx are clear.  Breasts normal female without masses.  Cardiac exam regular rate and rhythm normal S1 and S2 without murmurs.  Abdomen soft nondistended without hepatosplenomegaly masses or tenderness.  Genitourinary exam deferred to Dr. cousins  No deformity of the lower extremities.  Neurological exam she is alert and oriented x3 with no focal deficits.       Assessment & Plan:  Iron deficiency anemia.  History of constipation with oral iron therapy.  Refer to hematology for IV iron infusions.  Needs to consider ablation or hysterectomy after pandemic under control per GYN physician.  Vitamin D deficiency-will be treated with Drisdol 50,000 units weekly as she has trouble remembering to take daily vitamin D supplementation.  Would like to check vitamin D level again in 6 months.  Hematology referral will be made.  To have flu vaccine through employment.  Patient is to call employee health to check on date of tetanus immunization.

## 2018-10-27 ENCOUNTER — Other Ambulatory Visit: Payer: Self-pay

## 2018-10-27 ENCOUNTER — Inpatient Hospital Stay: Payer: 59 | Attending: Oncology | Admitting: Oncology

## 2018-10-27 VITALS — BP 131/83 | HR 72 | Temp 98.3°F | Resp 17 | Ht 66.0 in | Wt 262.0 lb

## 2018-10-27 DIAGNOSIS — D509 Iron deficiency anemia, unspecified: Secondary | ICD-10-CM

## 2018-10-27 DIAGNOSIS — N920 Excessive and frequent menstruation with regular cycle: Secondary | ICD-10-CM | POA: Insufficient documentation

## 2018-10-27 DIAGNOSIS — D500 Iron deficiency anemia secondary to blood loss (chronic): Secondary | ICD-10-CM | POA: Insufficient documentation

## 2018-10-27 NOTE — Progress Notes (Signed)
Hematology and Oncology Follow Up Visit  Lori Lara [id number] December 30, 1975 43 y.o. 10/27/2018 2:38 PM Baxley, Lori Lara, MDBaxley, Lori Lick, MD   Principle Diagnosis: 43 year old woman with iron deficiency anemia diagnosed in July 2018.  He presented with a hemoglobin of 7.4, iron of 14 with saturation of 3%.  Her iron deficiency is related to menstrual blood losses and menorrhagia.   Prior Therapy:  Oral iron therapy that has been unsuccessful in the past.  She is status post Feraheme infusion in December 2018.  Current therapy: Oral iron therapy.  Interim History: [name] presents today for a follow-up visit.  He is a pleasant 43 year old woman evaluated by Dr. Beryle Beams in the past for iron deficiency anemia and was treated with intravenous iron in December 2018.  She had a physical with her primary care physician in July 2020 and iron studies at that time showed iron level of 17 and ferritin of 5.  Her hemoglobin at the time was 10.4.  Repeat CBC after taking oral iron continues to show hemoglobin of 10.2 with MCV of 79.  He does report some mild fatigue and tiredness but for the most part she does not report any chest pain palpitation or orthopnea.  He does have menorrhagia related to uterine fibroids which has not changed.  She does not report any headaches, blurry vision, syncope or seizures. Does not report any fevers, chills or sweats.  Does not report any cough, wheezing or hemoptysis.  Does not report any chest pain, palpitation, orthopnea or leg edema.  Does not report any nausea, vomiting or abdominal pain.  Does not report any constipation or diarrhea.  Does not report any skeletal complaints.    Does not report frequency, urgency or hematuria.  Does not report any skin rashes or lesions. Does not report any heat or cold intolerance.  Does not report any lymphadenopathy or petechiae.  Does not report any anxiety or depression.  Remaining review of systems is negative.     Medications: I have reviewed the patient's current medications.  Current Outpatient Medications  Medication Sig Dispense Refill  . ergocalciferol (DRISDOL) 1.25 MG (50000 UT) capsule Take 1 capsule (50,000 Units total) by mouth once a week. 12 capsule 3  . ferrous sulfate 220 (44 Fe) MG/5ML solution Take 220 mg by mouth daily with breakfast.     No current facility-administered medications for this visit.      Allergies: No Known Allergies  Past Medical History, Surgical history, Social history, and Family History were reviewed and updated.    Physical Exam: Blood pressure 131/83, pulse 72, temperature 98.3 F (36.8 C), temperature source Oral, resp. rate 17, height 5\' 6"  (1.676 m), weight 262 lb (118.8 kg), last menstrual period 09/28/2018, SpO2 100 %. ECOG: 0 General appearance: alert and cooperative appeared without distress. Head: Normocephalic, without obvious abnormality Oropharynx: No oral thrush or ulcers. Eyes: No scleral icterus.  Pupils are equal and round reactive to light. Lymph nodes: Cervical, supraclavicular, and axillary nodes normal. Heart:regular rate and rhythm, S1, S2 normal, no murmur, click, rub or gallop Lung:chest clear, no wheezing, rales, normal symmetric air entry Abdomin: soft, non-tender, without masses or organomegaly. Neurological: No motor, sensory deficits.  Intact deep tendon reflexes. Skin: No rashes or lesions.  No ecchymosis or petechiae. Musculoskeletal: No joint deformity or effusion. Psychiatric: Mood and affect are appropriate.    Lab Results: Lab Results  Component Value Date   WBC 6.6 10/12/2018   HGB 10.2 (L) 10/12/2018  HCT 33.6 (L) 10/12/2018   MCV 79.1 (L) 10/12/2018   PLT 384 10/12/2018     Chemistry      Component Value Date/Time   NA 138 10/12/2018 1230   K 4.2 10/12/2018 1230   CL 105 10/12/2018 1230   CO2 24 10/12/2018 1230   BUN 10 10/12/2018 1230   CREATININE 0.79 10/12/2018 1230      Component Value  Date/Time   CALCIUM 9.2 10/12/2018 1230   ALKPHOS 47 08/27/2016 1052   AST 14 10/12/2018 1230   ALT 10 10/12/2018 1230   BILITOT 0.6 10/12/2018 1230       Impression and Plan:   43 year old woman with:  1.  Iron deficiency anemia diagnosed in July 2018.  Her iron deficiency is related to chronic menstrual blood losses and menorrhagia.  He is status post intravenous iron completed in December 2018 without any complications and improvement in her overall symptoms.  Recent laboratory testing showed recurrence of her iron deficiency despite oral iron therapy.  Her hemoglobin is stable and she is asymptomatic.  Treatment options were reviewed today.  Intravenous iron in the form of Feraheme or benefit were reviewed today.  Complications include nausea, fatigue, infusion related complications among others.  The other option is continuing oral iron therapy given the fact that she is maintaining her hemoglobin relatively asymptomatic.  He understands that this strategy might not be effective long-term and she might require intravenous iron eventually.  After discussion today, she opted to defer the intravenous iron for the time being I will recheck her iron levels and 3 months and reevaluate.  2.  Menorrhagia: I encouraged her to follow with gynecology regarding definitive treatment and possible ablation versus hysterectomy.  He understands without correcting the underlying issue she will continue to have recurrent iron deficiency anemia.  3. follow-up: She will return in 3 months for repeat evaluation.  25  minutes was spent with the patient face-to-face today.  More than 50% of time was dedicated to reviewing laboratory data, treatment options and complications related to therapy.    Zola Button, MD 9/18/20202:38 PM

## 2018-10-30 ENCOUNTER — Telehealth: Payer: Self-pay | Admitting: Oncology

## 2018-10-30 NOTE — Telephone Encounter (Signed)
Scheduled appt per 9/18 sch message  Left a voice message of appt date and time.

## 2018-12-11 MED FILL — VIT D2 1.25 MG (50,000 UNIT: 1.25 MG | 84 days supply | Qty: 12 | Fill #0

## 2019-01-12 ENCOUNTER — Telehealth: Payer: Self-pay | Admitting: Oncology

## 2019-01-12 NOTE — Telephone Encounter (Signed)
Called patient per provider request to convert 12/15 appointment to virtual, left a voicemail.

## 2019-01-15 ENCOUNTER — Encounter: Payer: Self-pay | Admitting: Oncology

## 2019-01-16 ENCOUNTER — Telehealth: Payer: Self-pay | Admitting: Oncology

## 2019-01-16 NOTE — Telephone Encounter (Signed)
R/s appt per 12/7 sch message - unable to reach pt . Left message with new appt date and time

## 2019-01-19 ENCOUNTER — Other Ambulatory Visit: Payer: 59

## 2019-01-23 ENCOUNTER — Ambulatory Visit: Payer: 59 | Admitting: Oncology

## 2019-02-23 ENCOUNTER — Inpatient Hospital Stay: Payer: 59 | Attending: Oncology

## 2019-02-23 ENCOUNTER — Other Ambulatory Visit: Payer: Self-pay

## 2019-02-23 DIAGNOSIS — D500 Iron deficiency anemia secondary to blood loss (chronic): Secondary | ICD-10-CM | POA: Diagnosis not present

## 2019-02-23 DIAGNOSIS — D509 Iron deficiency anemia, unspecified: Secondary | ICD-10-CM

## 2019-02-23 DIAGNOSIS — N920 Excessive and frequent menstruation with regular cycle: Secondary | ICD-10-CM | POA: Insufficient documentation

## 2019-02-23 LAB — CBC WITH DIFFERENTIAL (CANCER CENTER ONLY)
Abs Immature Granulocytes: 0.02 10*3/uL (ref 0.00–0.07)
Basophils Absolute: 0.1 10*3/uL (ref 0.0–0.1)
Basophils Relative: 1 %
Eosinophils Absolute: 0.2 10*3/uL (ref 0.0–0.5)
Eosinophils Relative: 2 %
HCT: 29.1 % — ABNORMAL LOW (ref 36.0–46.0)
Hemoglobin: 8.1 g/dL — ABNORMAL LOW (ref 12.0–15.0)
Immature Granulocytes: 0 %
Lymphocytes Relative: 23 %
Lymphs Abs: 1.7 10*3/uL (ref 0.7–4.0)
MCH: 19.3 pg — ABNORMAL LOW (ref 26.0–34.0)
MCHC: 27.8 g/dL — ABNORMAL LOW (ref 30.0–36.0)
MCV: 69.5 fL — ABNORMAL LOW (ref 80.0–100.0)
Monocytes Absolute: 0.5 10*3/uL (ref 0.1–1.0)
Monocytes Relative: 6 %
Neutro Abs: 5.1 10*3/uL (ref 1.7–7.7)
Neutrophils Relative %: 68 %
Platelet Count: 430 10*3/uL — ABNORMAL HIGH (ref 150–400)
RBC: 4.19 MIL/uL (ref 3.87–5.11)
RDW: 16.4 % — ABNORMAL HIGH (ref 11.5–15.5)
WBC Count: 7.5 10*3/uL (ref 4.0–10.5)
nRBC: 0 % (ref 0.0–0.2)

## 2019-02-23 LAB — CMP (CANCER CENTER ONLY)
ALT: 11 U/L (ref 0–44)
AST: 14 U/L — ABNORMAL LOW (ref 15–41)
Albumin: 3.7 g/dL (ref 3.5–5.0)
Alkaline Phosphatase: 50 U/L (ref 38–126)
Anion gap: 6 (ref 5–15)
BUN: 10 mg/dL (ref 6–20)
CO2: 24 mmol/L (ref 22–32)
Calcium: 8.2 mg/dL — ABNORMAL LOW (ref 8.9–10.3)
Chloride: 107 mmol/L (ref 98–111)
Creatinine: 0.69 mg/dL (ref 0.44–1.00)
GFR, Est AFR Am: >60 mL/min (ref >60)
GFR, Estimated: >60 mL/min (ref >60)
Glucose, Bld: 101 mg/dL — ABNORMAL HIGH (ref 70–99)
Potassium: 3.7 mmol/L (ref 3.5–5.1)
Sodium: 137 mmol/L (ref 135–145)
Total Bilirubin: 0.5 mg/dL (ref 0.3–1.2)
Total Protein: 6.6 g/dL (ref 6.5–8.1)

## 2019-02-23 LAB — IRON AND TIBC
Iron: 13 ug/dL — ABNORMAL LOW (ref 41–142)
Saturation Ratios: 3 % — ABNORMAL LOW (ref 21–57)
TIBC: 391 ug/dL (ref 236–444)
UIBC: 379 ug/dL (ref 120–384)

## 2019-02-23 LAB — FERRITIN: Ferritin: <4 ng/mL — ABNORMAL LOW (ref 11–307)

## 2019-03-02 ENCOUNTER — Inpatient Hospital Stay (HOSPITAL_BASED_OUTPATIENT_CLINIC_OR_DEPARTMENT_OTHER): Payer: 59 | Admitting: Oncology

## 2019-03-02 DIAGNOSIS — D509 Iron deficiency anemia, unspecified: Secondary | ICD-10-CM

## 2019-03-02 NOTE — Progress Notes (Signed)
Hematology and Oncology Follow Up for Telemedicine Visits  Lori Lara [id number] 1975/05/20 44 y.o. 03/02/2019 3:24 PM Baxley, Lori Lara, MDBaxley, Lori Lick, MD   I connected with Lori Lara on 03/02/19 at  3:45 PM EST by video enabled telemedicine visit and verified that I am speaking with the correct person using two identifiers.   I discussed the limitations, risks, security and privacy concerns of performing an evaluation and management service by telemedicine and the availability of in-person appointments. I also discussed with the patient that there may be a patient responsible charge related to this service. The patient expressed understanding and agreed to proceed.  Other persons participating in the visit and their role in the encounter:  None  Patient's location:Home  Provider's location:  Office.     Principle Diagnosis:  44 year old woman with anemia related to chronic menstrual blood losses and iron deficiency diagnosed in July 2018.     Prior Therapy:   She is status post Feraheme infusion in December 2018.  Current therapy:  Iron sulfate 220 mg daily.   Interim History: Lori. [name] reports increased fatigue and tiredness since the last visit.  She has also reported some occasional dyspnea but no chest pain or shortness of breath at rest.  She has been taking oral iron although she had that missed 1 month of treatment because of constipation.  He denies any hematochezia, melena or hemoptysis.  She continues to have regular menstrual cycles that are periodically heavy and last for 5 to 7 days.     Medications: Reviewed without any changes. Current Outpatient Medications  Medication Sig Dispense Refill  . ergocalciferol (DRISDOL) 1.25 MG (50000 UT) capsule Take 1 capsule (50,000 Units total) by mouth once a week. 12 capsule 3  . ferrous sulfate 220 (44 Fe) MG/5ML solution Take 220 mg by mouth daily with breakfast.     No current facility-administered  medications for this visit.     Allergies: No Known Allergies     Lab Results: Lab Results  Component Value Date   WBC 7.5 02/23/2019   HGB 8.1 (L) 02/23/2019   HCT 29.1 (L) 02/23/2019   MCV 69.5 (L) 02/23/2019   PLT 430 (H) 02/23/2019     Chemistry      Component Value Date/Time   NA 137 02/23/2019 0828   K 3.7 02/23/2019 0828   CL 107 02/23/2019 0828   CO2 24 02/23/2019 0828   BUN 10 02/23/2019 0828   CREATININE 0.69 02/23/2019 0828   CREATININE 0.79 10/12/2018 1230      Component Value Date/Time   CALCIUM 8.2 (L) 02/23/2019 0828   ALKPHOS 50 02/23/2019 0828   AST 14 (L) 02/23/2019 0828   ALT 11 02/23/2019 0828   BILITOT 0.5 02/23/2019 0828     Results for Lori Lara (MRN [id number]) as of 03/02/2019 15:26  Ref. Range 02/23/2019 08:27  Iron Latest Ref Range: 41 - 142 ug/dL 13 (L)  UIBC Latest Ref Range: 120 - 384 ug/dL 379  TIBC Latest Ref Range: 236 - 444 ug/dL 391  Saturation Ratios Latest Ref Range: 21 - 57 % 3 (L)  Ferritin Latest Ref Range: 11 - 307 ng/mL <4 (L)      Impression and Plan:  44 year old woman with:  1.  Anemia diagnosed in 2018.  Her anemia is related to Iron deficiency from chronic menstrual blood losses.  She has received intravenous iron in the past and currently on oral iron therapy.    Laboratory  data obtained on February 23, 2019 were reviewed and showed persistent iron deficiency with iron level of 13, saturation of 3% and low ferritin.  Her hemoglobin has declined further at this time.  Risks and benefits of Feraheme infusion was reviewed today.  Potential complications including arthralgias, myalgias and infusion related complications were discussed.  Alternative options with continuing oral iron therapy or back red cell transfusion were reviewed.  After discussion today, she is agreeable to receive intravenous iron in the form of Feraheme which will be scheduled in the near future.   2.  Menorrhagia: Continues to be  an issue at this time.  I urged her to follow-up with OB/GYN regarding this issue.  3. follow-up: Will be in 3 months for repeat evaluation.    I discussed the assessment and treatment plan with the patient. The patient was provided an opportunity to ask questions and all were answered. The patient agreed with the plan and demonstrated an understanding of the instructions.   The patient was advised to call back or seek an in-person evaluation if the symptoms worsen or if the condition fails to improve as anticipated.  I provided 20 minutes of face-to-face video visit time during this encounter, and > 50% was dedicated to reviewing laboratory data, discussing treatment options and answering questions regarding future plan of care.  Zola Button, MD 03/02/2019 3:24 PM

## 2019-03-09 ENCOUNTER — Telehealth: Payer: Self-pay | Admitting: Oncology

## 2019-03-09 NOTE — Telephone Encounter (Signed)
Scheduled appt per 1/22 los.  Patient requested 2/8 to start her iron due to her work schedule and having someone to cover for her.  She is aware of the scheduled appt dates and time.

## 2019-03-19 ENCOUNTER — Inpatient Hospital Stay: Payer: 59 | Attending: Oncology

## 2019-03-19 ENCOUNTER — Telehealth: Payer: Self-pay | Admitting: *Deleted

## 2019-03-19 ENCOUNTER — Telehealth: Payer: Self-pay | Admitting: Internal Medicine

## 2019-03-19 ENCOUNTER — Other Ambulatory Visit: Payer: Self-pay | Admitting: Oncology

## 2019-03-19 ENCOUNTER — Other Ambulatory Visit: Payer: Self-pay

## 2019-03-19 VITALS — BP 130/71 | HR 79 | Temp 98.5°F | Resp 16

## 2019-03-19 DIAGNOSIS — D509 Iron deficiency anemia, unspecified: Secondary | ICD-10-CM

## 2019-03-19 DIAGNOSIS — N920 Excessive and frequent menstruation with regular cycle: Secondary | ICD-10-CM | POA: Diagnosis not present

## 2019-03-19 DIAGNOSIS — D500 Iron deficiency anemia secondary to blood loss (chronic): Secondary | ICD-10-CM | POA: Diagnosis not present

## 2019-03-19 MED ORDER — SODIUM CHLORIDE 0.9 % IV SOLN
Freq: Once | INTRAVENOUS | Status: AC
  Filled 2019-03-19: qty 250

## 2019-03-19 MED ORDER — SODIUM CHLORIDE 0.9 % IV SOLN
200.0000 mg | Freq: Once | INTRAVENOUS | Status: AC
  Administered 2019-03-19: 200 mg via INTRAVENOUS
  Filled 2019-03-19: qty 200

## 2019-03-19 NOTE — Telephone Encounter (Signed)
LM with note below. To call if has questions 

## 2019-03-19 NOTE — Telephone Encounter (Signed)
LVM to CB and reschedule lab appointment

## 2019-03-19 NOTE — Patient Instructions (Signed)

## 2019-03-19 NOTE — Telephone Encounter (Signed)
-----   Message from Wyatt Portela, MD sent at 03/19/2019 10:23 AM EST ----- Orders are changed to Venofer.   Aadhya Bustamante: Can you please let the patient know that we have changed to for all iron formulation based on insurance.  That means that she requires 3 more weekly IV iron infusion appointments.  Message to scheduling sent.  Thanks ----- Message ----- From: Tora Kindred, [facility] Sent: 03/19/2019   9:37 AM EST To: Gaspar Bidding, Epifanio Lesches, #  This pt is coming today (03/19/19) at 1445 for infusion.    [id number] - Iron Dextran J1756 - Venofer [id number] - Ferrlecit  These would be used 1st/are preferred.  [id number] Shirlean Kelly is not preferred.  PA Team, please correct me if I am wrong. Thanks! Vergia Alcon ----- Message ----- From: Wyatt Portela, MD Sent: 03/19/2019   9:28 AM EST To: Gaspar Bidding, Charmayne Sheer, #  Can you please tell me exactly what iron formulation (names not codes) are approved?  Thanks. ----- Message ----- From: Epifanio Lesches Sent: 03/19/2019   8:49 AM EST To: Gaspar Bidding, Wyatt Portela, MD, #  Good morning!  This pt is scheduled today for Iron. Per her insurance([employer]) she must try and fail either J1750, J1756 or J2916 prior to them approving [phone]. Neither of their preferred iron products require authorization. Please update pts order or let me know if pt has to use Q0138 and I will continue with the PA request.  Thanks Levada Dy

## 2019-03-20 ENCOUNTER — Telehealth: Payer: Self-pay | Admitting: Oncology

## 2019-03-20 NOTE — Telephone Encounter (Signed)
Rescheduled per 2/9 sch msg, pt req. Confirmed change with RN. Called and spoke with pt, confirmed 2/26, 3/5, and 3/12 appt

## 2019-03-26 ENCOUNTER — Other Ambulatory Visit: Payer: Self-pay

## 2019-03-26 ENCOUNTER — Inpatient Hospital Stay: Payer: 59

## 2019-03-26 VITALS — BP 118/76 | HR 72 | Temp 98.9°F | Resp 16

## 2019-03-26 DIAGNOSIS — D509 Iron deficiency anemia, unspecified: Secondary | ICD-10-CM

## 2019-03-26 DIAGNOSIS — N920 Excessive and frequent menstruation with regular cycle: Secondary | ICD-10-CM | POA: Diagnosis not present

## 2019-03-26 DIAGNOSIS — D500 Iron deficiency anemia secondary to blood loss (chronic): Secondary | ICD-10-CM | POA: Diagnosis not present

## 2019-03-26 MED ORDER — SODIUM CHLORIDE 0.9 % IV SOLN
200.0000 mg | Freq: Once | INTRAVENOUS | Status: AC
  Administered 2019-03-26: 200 mg via INTRAVENOUS
  Filled 2019-03-26: qty 200

## 2019-03-26 MED ORDER — SODIUM CHLORIDE 0.9 % IV SOLN
Freq: Once | INTRAVENOUS | Status: AC
  Filled 2019-03-26: qty 250

## 2019-03-26 NOTE — Patient Instructions (Signed)

## 2019-04-03 ENCOUNTER — Ambulatory Visit: Payer: 59

## 2019-04-03 ENCOUNTER — Telehealth: Payer: Self-pay | Admitting: Internal Medicine

## 2019-04-03 NOTE — Telephone Encounter (Signed)
Appointment scheduled.

## 2019-04-03 NOTE — Telephone Encounter (Signed)
Lori Lara [phone] till 3pm [phone] after 3  Charestta called to say she thinks she has UTI, she has urgency to go, bladder hurts and she also has vaginal discharge. She would like to be seen on Friday if possible.

## 2019-04-03 NOTE — Telephone Encounter (Signed)
Could she come in am? Maybe should not wait too long if indeed has UTI

## 2019-04-04 ENCOUNTER — Encounter: Payer: Self-pay | Admitting: Internal Medicine

## 2019-04-04 ENCOUNTER — Ambulatory Visit: Payer: 59 | Admitting: Internal Medicine

## 2019-04-04 ENCOUNTER — Other Ambulatory Visit: Payer: Self-pay

## 2019-04-04 VITALS — BP 102/70 | HR 75 | Ht 66.0 in | Wt 255.0 lb

## 2019-04-04 DIAGNOSIS — N3941 Urge incontinence: Secondary | ICD-10-CM

## 2019-04-04 DIAGNOSIS — R3915 Urgency of urination: Secondary | ICD-10-CM | POA: Diagnosis not present

## 2019-04-04 DIAGNOSIS — N898 Other specified noninflammatory disorders of vagina: Secondary | ICD-10-CM | POA: Diagnosis not present

## 2019-04-04 DIAGNOSIS — R350 Frequency of micturition: Secondary | ICD-10-CM | POA: Diagnosis not present

## 2019-04-04 DIAGNOSIS — E559 Vitamin D deficiency, unspecified: Secondary | ICD-10-CM

## 2019-04-04 DIAGNOSIS — R8290 Unspecified abnormal findings in urine: Secondary | ICD-10-CM

## 2019-04-04 DIAGNOSIS — D500 Iron deficiency anemia secondary to blood loss (chronic): Secondary | ICD-10-CM | POA: Diagnosis not present

## 2019-04-04 LAB — POCT URINALYSIS DIPSTICK
Appearance: NEGATIVE
Bilirubin, UA: NEGATIVE
Blood, UA: NEGATIVE
Glucose, UA: NEGATIVE
Ketones, UA: NEGATIVE
Nitrite, UA: NEGATIVE
Odor: NEGATIVE
Protein, UA: NEGATIVE
Spec Grav, UA: 1.01 (ref 1.010–1.025)
Urobilinogen, UA: 0.2 E.U./dL
pH, UA: 6.5 (ref 5.0–8.0)

## 2019-04-04 MED ORDER — DOXYCYCLINE HYCLATE 100 MG PO TABS: 100.0000 mg | ORAL_TABLET | Freq: Two times a day (BID) | ORAL | 0 refills | Status: DC

## 2019-04-04 MED FILL — DOXYCYCLINE HYCLATE 100 MG: 100 | 10 days supply | Qty: 20 | Fill #0

## 2019-04-04 NOTE — Patient Instructions (Signed)
Doxycycline 100 mg twice daily for 10 days pending STD testing and urine culture results.  Recommend trying to void at regular intervals for urge urinary incontinence.  Use vinegar and water douche once after completing course of doxycycline.

## 2019-04-04 NOTE — Progress Notes (Signed)
   Subjective:    Patient ID: Lori Lara, female    DOB: November 08, 1975, 44 y.o.   MRN: [id number]  HPI 44 year old Female with urge urinary incontinence and vaginal discharge. Onset vaginal thick discharge last weekend. Had some perineal irritation as well. Is sexually active with long term partner. No prior history of STDs.  Hx iron deficiency anemia and gets iron infusions from Dr. Alen Lara.  Hx Vitamin D deficiency. Still taking high dose Vitamin D weekly.  Patient thought she might have UTI but no dysuria or hematuria.     Review of Systems no fever chills or abdominal pain. Sees Dr. Garwin Lara for GYN care. Has upcoming appt in the Spring.     Objective:   Physical Exam Clean catch urine specimen is abnormal and sent for culture. There is no perineal irritation at the present time and no intertrigo. She has a thick yellow vaginal discharge. GC and Chlamydia testing ordered.       Assessment & Plan:  Vaginitis- possible STD Start Doxycycline 100 mg twice daily x 10 days and uses vinegar and water douche once after finishing antibiotic.  Urge urinary incontinence- can discuss with GYN but suggest trying to void at timed intervals and not wait too long. I have not have much success with oral agents for urge urinary incontinence.

## 2019-04-05 LAB — URINE CULTURE
MICRO NUMBER:: 10183678
SPECIMEN QUALITY:: ADEQUATE

## 2019-04-05 LAB — C. TRACHOMATIS/N. GONORRHOEAE RNA
C. trachomatis RNA, TMA: NOT DETECTED
N. gonorrhoeae RNA, TMA: NOT DETECTED

## 2019-04-06 ENCOUNTER — Other Ambulatory Visit: Payer: Self-pay

## 2019-04-06 ENCOUNTER — Inpatient Hospital Stay: Payer: 59

## 2019-04-06 VITALS — BP 120/76 | HR 67 | Temp 98.2°F | Resp 18

## 2019-04-06 DIAGNOSIS — D500 Iron deficiency anemia secondary to blood loss (chronic): Secondary | ICD-10-CM | POA: Diagnosis not present

## 2019-04-06 DIAGNOSIS — D509 Iron deficiency anemia, unspecified: Secondary | ICD-10-CM

## 2019-04-06 DIAGNOSIS — N920 Excessive and frequent menstruation with regular cycle: Secondary | ICD-10-CM | POA: Diagnosis not present

## 2019-04-06 MED ORDER — SODIUM CHLORIDE 0.9 % IV SOLN
200.0000 mg | Freq: Once | INTRAVENOUS | Status: AC
  Administered 2019-04-06: 14:00:00 200 mg via INTRAVENOUS
  Filled 2019-04-06: qty 200

## 2019-04-06 MED ORDER — SODIUM CHLORIDE 0.9 % IV SOLN
Freq: Once | INTRAVENOUS | Status: AC
  Filled 2019-04-06: qty 250

## 2019-04-06 NOTE — Patient Instructions (Signed)

## 2019-04-10 ENCOUNTER — Ambulatory Visit: Payer: 59

## 2019-04-13 ENCOUNTER — Other Ambulatory Visit: Payer: Self-pay

## 2019-04-13 ENCOUNTER — Inpatient Hospital Stay: Payer: 59 | Attending: Oncology

## 2019-04-13 VITALS — BP 119/74 | HR 64 | Temp 97.4°F | Resp 18

## 2019-04-13 DIAGNOSIS — D500 Iron deficiency anemia secondary to blood loss (chronic): Secondary | ICD-10-CM | POA: Insufficient documentation

## 2019-04-13 DIAGNOSIS — N920 Excessive and frequent menstruation with regular cycle: Secondary | ICD-10-CM | POA: Insufficient documentation

## 2019-04-13 DIAGNOSIS — D509 Iron deficiency anemia, unspecified: Secondary | ICD-10-CM

## 2019-04-13 MED ORDER — SODIUM CHLORIDE 0.9 % IV SOLN
Freq: Once | INTRAVENOUS | Status: AC
  Filled 2019-04-13: qty 250

## 2019-04-13 MED ORDER — SODIUM CHLORIDE 0.9 % IV SOLN
200.0000 mg | Freq: Once | INTRAVENOUS | Status: AC
  Administered 2019-04-13: 200 mg via INTRAVENOUS
  Filled 2019-04-13: qty 200

## 2019-04-17 ENCOUNTER — Ambulatory Visit: Payer: 59

## 2019-04-18 ENCOUNTER — Telehealth: Payer: Self-pay | Admitting: Oncology

## 2019-04-18 NOTE — Telephone Encounter (Signed)
Called pt per 3/10 sch message - request to r/s  Unable to reach pt . Left message for pt to call back

## 2019-04-20 ENCOUNTER — Inpatient Hospital Stay: Payer: 59

## 2019-04-20 ENCOUNTER — Other Ambulatory Visit: Payer: Self-pay

## 2019-04-20 VITALS — BP 118/73 | HR 84 | Temp 98.3°F | Resp 17

## 2019-04-20 DIAGNOSIS — D500 Iron deficiency anemia secondary to blood loss (chronic): Secondary | ICD-10-CM | POA: Diagnosis not present

## 2019-04-20 DIAGNOSIS — N920 Excessive and frequent menstruation with regular cycle: Secondary | ICD-10-CM | POA: Diagnosis not present

## 2019-04-20 DIAGNOSIS — D509 Iron deficiency anemia, unspecified: Secondary | ICD-10-CM

## 2019-04-20 MED ORDER — SODIUM CHLORIDE 0.9 % IV SOLN
Freq: Once | INTRAVENOUS | Status: AC
  Filled 2019-04-20: qty 250

## 2019-04-20 MED ORDER — SODIUM CHLORIDE 0.9 % IV SOLN
200.0000 mg | Freq: Once | INTRAVENOUS | Status: AC
  Administered 2019-04-20: 200 mg via INTRAVENOUS
  Filled 2019-04-20: qty 200

## 2019-04-20 NOTE — Patient Instructions (Signed)

## 2019-04-27 DIAGNOSIS — D251 Intramural leiomyoma of uterus: Secondary | ICD-10-CM | POA: Diagnosis not present

## 2019-04-27 DIAGNOSIS — N920 Excessive and frequent menstruation with regular cycle: Secondary | ICD-10-CM | POA: Diagnosis not present

## 2019-04-27 DIAGNOSIS — N760 Acute vaginitis: Secondary | ICD-10-CM | POA: Diagnosis not present

## 2019-04-27 MED FILL — TINIDAZOLE 500 MG TABS: 500 | 2 days supply | Qty: 8 | Fill #0

## 2019-05-31 ENCOUNTER — Other Ambulatory Visit: Payer: 59

## 2019-05-31 ENCOUNTER — Ambulatory Visit: Payer: 59 | Admitting: Oncology

## 2019-05-31 DIAGNOSIS — N920 Excessive and frequent menstruation with regular cycle: Secondary | ICD-10-CM | POA: Diagnosis not present

## 2019-05-31 DIAGNOSIS — D251 Intramural leiomyoma of uterus: Secondary | ICD-10-CM | POA: Diagnosis not present

## 2019-05-31 DIAGNOSIS — D252 Subserosal leiomyoma of uterus: Secondary | ICD-10-CM | POA: Diagnosis not present

## 2019-06-01 ENCOUNTER — Other Ambulatory Visit: Payer: Self-pay

## 2019-06-01 ENCOUNTER — Inpatient Hospital Stay: Payer: 59 | Admitting: Oncology

## 2019-06-01 ENCOUNTER — Inpatient Hospital Stay: Payer: 59 | Attending: Oncology

## 2019-06-01 VITALS — BP 122/70 | HR 79 | Temp 97.8°F | Resp 18 | Ht 66.0 in | Wt 263.9 lb

## 2019-06-01 DIAGNOSIS — D259 Leiomyoma of uterus, unspecified: Secondary | ICD-10-CM | POA: Diagnosis not present

## 2019-06-01 DIAGNOSIS — D509 Iron deficiency anemia, unspecified: Secondary | ICD-10-CM

## 2019-06-01 DIAGNOSIS — D500 Iron deficiency anemia secondary to blood loss (chronic): Secondary | ICD-10-CM | POA: Insufficient documentation

## 2019-06-01 DIAGNOSIS — N920 Excessive and frequent menstruation with regular cycle: Secondary | ICD-10-CM | POA: Insufficient documentation

## 2019-06-01 LAB — CBC WITH DIFFERENTIAL (CANCER CENTER ONLY)
Abs Immature Granulocytes: 0.01 10*3/uL (ref 0.00–0.07)
Basophils Absolute: 0.1 10*3/uL (ref 0.0–0.1)
Basophils Relative: 1 %
Eosinophils Absolute: 0.2 10*3/uL (ref 0.0–0.5)
Eosinophils Relative: 3 %
HCT: 31.6 % — ABNORMAL LOW (ref 36.0–46.0)
Hemoglobin: 9.5 g/dL — ABNORMAL LOW (ref 12.0–15.0)
Immature Granulocytes: 0 %
Lymphocytes Relative: 33 %
Lymphs Abs: 2.3 10*3/uL (ref 0.7–4.0)
MCH: 24.4 pg — ABNORMAL LOW (ref 26.0–34.0)
MCHC: 30.1 g/dL (ref 30.0–36.0)
MCV: 81 fL (ref 80.0–100.0)
Monocytes Absolute: 0.4 10*3/uL (ref 0.1–1.0)
Monocytes Relative: 6 %
Neutro Abs: 3.9 10*3/uL (ref 1.7–7.7)
Neutrophils Relative %: 57 %
Platelet Count: 387 10*3/uL (ref 150–400)
RBC: 3.9 MIL/uL (ref 3.87–5.11)
RDW: 19.2 % — ABNORMAL HIGH (ref 11.5–15.5)
WBC Count: 6.8 10*3/uL (ref 4.0–10.5)
nRBC: 0 % (ref 0.0–0.2)

## 2019-06-01 NOTE — Progress Notes (Signed)
Hematology and Oncology Follow Up   Aliannah Devall [id number] 1975-08-08 44 y.o. 06/01/2019 2:54 PM Baxley, [name], MDBaxley, [name], MD       Principle Diagnosis:  44 year old woman with iron deficiency anemia diagnosed in July 2018.  Her anemia is related to menorrhagia and chronic menstrual losses and uterine fibroids.    Prior Therapy:   She is status post Feraheme infusion in December 2018.  She is status post Venofer infusion completed on April 20, 2019 for a total of 1000 mg.   Current therapy:     Interim History: Ms. Sutton returns today for a repeat follow-up.  Since the last visit, she completed IV iron infusion on March 12 without any major complications.  Since that time, she reports feeling reasonably well without any complaints.  She denies any nausea vomiting or abdominal pain.  She denies any hematochezia or melena.  She does report heavy menstrual cycles attributed to uterine fibroids.     Medications: Reviewed without any changes. Current Outpatient Medications  Medication Sig Dispense Refill  . doxycycline (VIBRA-TABS) 100 MG tablet Take 1 tablet (100 mg total) by mouth 2 (two) times daily. 20 tablet 0  . ergocalciferol (DRISDOL) 1.25 MG (50000 UT) capsule Take 1 capsule (50,000 Units total) by mouth once a week. 12 capsule 3  . ferrous sulfate 220 (44 Fe) MG/5ML solution Take 220 mg by mouth daily with breakfast.     No current facility-administered medications for this visit.     Allergies: No Known Allergies  Physical exam: Blood pressure 122/70, pulse 79, temperature 97.8 F (36.6 C), temperature source Oral, resp. rate 18, height 5\' 6"  (1.676 m), weight 263 lb 14.4 oz (119.7 kg), SpO2 100 %.   ECOG   General appearance: Comfortable appearing without any discomfort Head: Normocephalic without any trauma Oropharynx: Mucous membranes are moist and pink without any thrush or ulcers. Eyes: Pupils are equal and round reactive to  light. Lymph nodes: No cervical, supraclavicular, inguinal or axillary lymphadenopathy.   Heart:regular rate and rhythm.  S1 and S2 without leg edema. Lung: Clear without any rhonchi or wheezes.  No dullness to percussion. Abdomin: Soft, nontender, nondistended with good bowel sounds.  No hepatosplenomegaly. Musculoskeletal: No joint deformity or effusion.  Full range of motion noted. Neurological: No deficits noted on motor, sensory and deep tendon reflex exam. Skin: No petechial rash or dryness.  Appeared moist.     Lab Results: Lab Results  Component Value Date   WBC 7.5 02/23/2019   HGB 8.1 (L) 02/23/2019   HCT 29.1 (L) 02/23/2019   MCV 69.5 (L) 02/23/2019   PLT 430 (H) 02/23/2019     Chemistry      Component Value Date/Time   NA 137 02/23/2019 0828   K 3.7 02/23/2019 0828   CL 107 02/23/2019 0828   CO2 24 02/23/2019 0828   BUN 10 02/23/2019 0828   CREATININE 0.69 02/23/2019 0828   CREATININE 0.79 10/12/2018 1230      Component Value Date/Time   CALCIUM 8.2 (L) 02/23/2019 0828   ALKPHOS 50 02/23/2019 0828   AST 14 (L) 02/23/2019 0828   ALT 11 02/23/2019 0828   BILITOT 0.5 02/23/2019 0828     Results for YAFA, BEINE (MRN [id number]) as of 03/02/2019 15:26  Ref. Range 02/23/2019 08:27  Iron Latest Ref Range: 41 - 142 ug/dL 13 (L)  UIBC Latest Ref Range: 120 - 384 ug/dL 379  TIBC Latest Ref Range: 236 - 444 ug/dL  391  Saturation Ratios Latest Ref Range: 21 - 57 % 3 (L)  Ferritin Latest Ref Range: 11 - 307 ng/mL <4 (L)      Impression and Plan:  44 year old woman with:  1.  Iron deficiency anemia related to chronic menstrual blood losses diagnosed in 2018.    He is status post intravenous iron infusion given on few occasions the most recent of which done in March 2021.  The natural course of this disease and future treatment options were discussed.  Oral iron therapy, packed red cell transfusion and repeat intravenous iron would be options forward.   For the time being she will continue oral iron and replace with IV iron as needed.   2.  Menorrhagia: She has follow-up with her OB/GYN regarding this issue.  She understands as long as she is having heavy menstrual blood losses, recurrent iron deficiency and infusion may be needed.  She is under consideration for possible ablation for her uterine fibroids.  3. follow-up: Will be in 4 months for repeat follow-up.   30  minutes were dedicated to this visit. The time was spent on reviewing laboratory data, iscussing treatment options, and answering questions regarding future plan.   Zola Button, MD 06/01/2019 2:54 PM

## 2019-06-04 ENCOUNTER — Telehealth: Payer: Self-pay | Admitting: Oncology

## 2019-06-04 LAB — FERRITIN: Ferritin: <4 ng/mL — ABNORMAL LOW (ref 11–307)

## 2019-06-04 LAB — IRON AND TIBC
Iron: 19 ug/dL — ABNORMAL LOW (ref 41–142)
Saturation Ratios: 5 % — ABNORMAL LOW (ref 21–57)
TIBC: 380 ug/dL (ref 236–444)
UIBC: 361 ug/dL (ref 120–384)

## 2019-06-04 NOTE — Telephone Encounter (Signed)
Scheduled appt per 4/23 los.  Spoke with pt and she is aware of the appt date and time.

## 2019-06-27 DIAGNOSIS — N920 Excessive and frequent menstruation with regular cycle: Secondary | ICD-10-CM | POA: Diagnosis not present

## 2019-06-27 DIAGNOSIS — Z01419 Encounter for gynecological examination (general) (routine) without abnormal findings: Secondary | ICD-10-CM | POA: Diagnosis not present

## 2019-06-27 DIAGNOSIS — Z3202 Encounter for pregnancy test, result negative: Secondary | ICD-10-CM | POA: Diagnosis not present

## 2019-06-27 DIAGNOSIS — Z1231 Encounter for screening mammogram for malignant neoplasm of breast: Secondary | ICD-10-CM | POA: Diagnosis not present

## 2019-06-27 DIAGNOSIS — Z1151 Encounter for screening for human papillomavirus (HPV): Secondary | ICD-10-CM | POA: Diagnosis not present

## 2019-06-27 DIAGNOSIS — Z6841 Body Mass Index (BMI) 40.0 and over, adult: Secondary | ICD-10-CM | POA: Diagnosis not present

## 2019-06-27 LAB — HM MAMMOGRAPHY

## 2019-06-28 ENCOUNTER — Encounter: Payer: Self-pay | Admitting: Internal Medicine

## 2019-06-29 ENCOUNTER — Other Ambulatory Visit: Payer: Self-pay | Admitting: Obstetrics and Gynecology

## 2019-06-29 DIAGNOSIS — D250 Submucous leiomyoma of uterus: Secondary | ICD-10-CM

## 2019-07-11 ENCOUNTER — Ambulatory Visit
Admission: RE | Admit: 2019-07-11 | Discharge: 2019-07-11 | Disposition: A | Discharge status: Home / Self Care | Payer: 59 | Source: Ambulatory Visit | Attending: Obstetrics and Gynecology | Admitting: Obstetrics and Gynecology

## 2019-07-11 ENCOUNTER — Encounter: Payer: Self-pay | Admitting: *Deleted

## 2019-07-11 ENCOUNTER — Other Ambulatory Visit: Payer: Self-pay

## 2019-07-11 DIAGNOSIS — D250 Submucous leiomyoma of uterus: Secondary | ICD-10-CM

## 2019-07-11 DIAGNOSIS — D649 Anemia, unspecified: Secondary | ICD-10-CM | POA: Diagnosis not present

## 2019-07-11 DIAGNOSIS — D259 Leiomyoma of uterus, unspecified: Secondary | ICD-10-CM | POA: Diagnosis not present

## 2019-07-11 DIAGNOSIS — N920 Excessive and frequent menstruation with regular cycle: Secondary | ICD-10-CM | POA: Diagnosis not present

## 2019-07-11 HISTORY — PX: IR RADIOLOGIST EVAL & MGMT: IMG5224

## 2019-07-11 NOTE — Consult Note (Signed)
Chief Complaint: Patient was seen in consultation today for uterine fibroids and menorrhagia at the request of [name],[name]  Referring Physician(s): [name],[name]  History of Present Illness: Lori Lara is a 44 y.o. female with known uterine fibroids and menorrhagia and chronic anemia.  She has regular menstrual cycles every 25 to 28 days.  Typically she has 5 to 7 days of menstrual bleeding with 2 to 3 days of very heavy bleeding.  She is being treated for anemia with iron pills and iron infusions.  She reports minimal cramping.  She has chronic urinary urgency.  She has constipation but attributes it to her iron supplements.  History of bacterial vaginosis but no other significant pelvic inflammatory disease.  Pregnancy history is G4, P2.  She had 1 vaginal birth and one cesarean section.  She has no desire for future pregnancies.  She is not taking any hormonal therapy.  Pap smear collected on 06/27/1999 was negative for intraepithelial lesion or malignancy.  Endometrial biopsy on 06/27/1999 demonstrated endometrial mucosa with disordered proliferation changes, stromal breakdown and focal eosinophilic metaplasia.  No atypical hyperplasia or carcinoma seen.  No plasma cells identified.  Patient would like to avoid hysterectomy.  She works at [employer] and has been fully vaccinated for COVID-19.  Medications: Prior to Admission medications   Medication Sig Start Date End Date Taking? Authorizing Provider  doxycycline (VIBRA-TABS) 100 MG tablet Take 1 tablet (100 mg total) by mouth 2 (two) times daily. 04/04/19   Elby Showers, MD  ergocalciferol (DRISDOL) 1.25 MG (50000 UT) capsule Take 1 capsule (50,000 Units total) by mouth once a week. 10/18/18   Elby Showers, MD  ferrous sulfate 220 (44 Fe) MG/5ML solution Take 220 mg by mouth daily with breakfast.    [provider]     No family history on file.  Social History   Socioeconomic History  . Marital  status: Single    Spouse name: Not on file  . Number of children: Not on file  . Years of education: Not on file  . Highest education level: Not on file  Occupational History  . Not on file  Tobacco Use  . Smoking status: Never Smoker  . Smokeless tobacco: Never Used  Substance and Sexual Activity  . Alcohol use: No  . Drug use: No  . Sexual activity: Not on file  Other Topics Concern  . Not on file  Social History Narrative  . Not on file   Social Determinants of Health   Financial Resource Strain:   . Difficulty of Paying Living Expenses:   Food Insecurity:   . Worried About Charity fundraiser in the Last Year:   . Arboriculturist in the Last Year:   Transportation Needs:   . Film/video editor (Medical):   Marland Kitchen Lack of Transportation (Non-Medical):   Physical Activity:   . Days of Exercise per Week:   . Minutes of Exercise per Session:   Stress:   . Feeling of Stress :   Social Connections:   . Frequency of Communication with Friends and Family:   . Frequency of Social Gatherings with Friends and Family:   . Attends Religious Services:   . Active Member of Clubs or Organizations:   . Attends Archivist Meetings:   Marland Kitchen Marital Status:      Review of Systems  Gastrointestinal: Positive for constipation.  Genitourinary: Positive for urgency and vaginal bleeding.     Physical  Exam No direct physical exam was performed   Vital Signs: There were no vitals taken for this visit.  Imaging: No results found.  Labs:  CBC: Recent Labs    09/07/18 1655 10/12/18 1230 02/23/19 0828 06/01/19 1447  WBC 6.7 6.6 7.5 6.8  HGB 10.4* 10.2* 8.1* 9.5*  HCT 33.6* 33.6* 29.1* 31.6*  PLT 329 384 430* 387    COAGS: No results for input(s): INR, APTT in the last 8760 hours.  BMP: Recent Labs    10/12/18 1230 02/23/19 0828  NA 138 137  K 4.2 3.7  CL 105 107  CO2 24 24  GLUCOSE 75 101*  BUN 10 10  CALCIUM 9.2 8.2*  CREATININE 0.79 0.69  GFRNONAA  92 >60  GFRAA 106 >60    LIVER FUNCTION TESTS: Recent Labs    10/12/18 1230 02/23/19 0828  BILITOT 0.6 0.5  AST 14 14*  ALT 10 11  ALKPHOS  --  50  PROT 6.5 6.6  ALBUMIN  --  3.7    TUMOR MARKERS: No results for input(s): AFPTM, CEA, CA199, CHROMGRNA in the last 8760 hours.   Imaging: Ultrasound report dated 05/31/2019 from [facility]: Enlarged uterus with multiple fibroids.  Endometrium is ill-defined.  Cannot rule out submucosal fibroid.  No ovarian or adnexal abnormalities.  Assessment and Plan:  44 year old female with menorrhagia, uterine fibroids and anemia.  Patient requires iron supplements and previous iron infusion.  Ultrasound confirms uterine fibroids.  Heavy menstrual bleeding is likely associated with her fibroid disease.  Patient does not want a hysterectomy and interested in less invasive treatment options.  We discussed uterine artery embolization in detail.  We discussed the benefits of the procedure which include decreased bleeding after embolization and decreased bulk symptoms.  Patient is aware of the possible risks including vascular injury, bleeding and infection.  We discussed the post embolization recovery which includes overnight observation in the hospital for symptom control.  Patient will likely be out of work for 2 weeks.  Patient would need an MRI of the pelvis, with and without contrast, prior to the procedure.  Based on our discussion, patient would like to pursue the uterine artery embolization procedure.  We will schedule the patient for pelvic MRI to make sure there is no contraindications to the procedure.  If the patient is a candidate for the procedure based on MRI, then we will schedule her for the uterine artery embolization in the near future.  Thank you for this interesting consult.  I greatly enjoyed meeting Marja Manna Rochette and look forward to participating in their care.  A copy of this report was sent to the requesting provider on  this date.  Electronically Signed: Burman Riis 07/11/2019, 10:19 AM   I spent a total of 25 minutes   in remote  clinical consultation, greater than 50% of which was counseling/coordinating care for uterine fibroids and menorrhagia..    Visit type: Audio only (telephone). Audio (no video) only due to technical issues with camera.. Alternative for in-person consultation at [facility], [address], [place], [place]. This visit type was conducted due to national recommendations for restrictions regarding the COVID-19 Pandemic (e.g. social distancing).  This format is felt to be most appropriate for this patient at this time.  All issues noted in this document were discussed and addressed.

## 2019-07-12 ENCOUNTER — Other Ambulatory Visit: Payer: Self-pay | Admitting: Diagnostic Radiology

## 2019-07-12 DIAGNOSIS — D250 Submucous leiomyoma of uterus: Secondary | ICD-10-CM

## 2019-07-27 ENCOUNTER — Ambulatory Visit (HOSPITAL_COMMUNITY)
Admission: RE | Admit: 2019-07-27 | Discharge: 2019-07-27 | Disposition: A | Discharge status: Home / Self Care | Payer: 59 | Source: Ambulatory Visit | Attending: Diagnostic Radiology | Admitting: Diagnostic Radiology

## 2019-07-27 ENCOUNTER — Other Ambulatory Visit: Payer: Self-pay

## 2019-07-27 DIAGNOSIS — D250 Submucous leiomyoma of uterus: Secondary | ICD-10-CM | POA: Insufficient documentation

## 2019-07-27 DIAGNOSIS — D259 Leiomyoma of uterus, unspecified: Secondary | ICD-10-CM | POA: Diagnosis not present

## 2019-07-27 MED ORDER — GADOBUTROL 1 MMOL/ML IV SOLN
10.0000 mL | Freq: Once | INTRAVENOUS | Status: AC | PRN
  Administered 2019-07-27: 10 mL via INTRAVENOUS

## 2019-07-31 ENCOUNTER — Other Ambulatory Visit: Payer: Self-pay | Admitting: Diagnostic Radiology

## 2019-07-31 ENCOUNTER — Telehealth: Payer: Self-pay | Admitting: Diagnostic Radiology

## 2019-07-31 DIAGNOSIS — D250 Submucous leiomyoma of uterus: Secondary | ICD-10-CM

## 2019-07-31 NOTE — Telephone Encounter (Signed)
I have reviewed the patient's pelvic MRI from 07/27/2019.  Patient has multiple enhancing fibroids and she is a candidate for uterine artery embolization procedure.  However, she has at least 2 intracavitary fibroids that could potentially be a problem after embolization.  Largest intracavitary fibroid roughly measures 1.6 cm.  I explained these MRI findings to Lori Lara.  Explained that these intracavitary fibroids may not be a problem but could lead to fibroid expulsion or chronic vaginal discharge after the embolization.  I will make Dr. Servando Salina aware of these findings and see if she has an opinion about hysteroscopic removal of these intracavitary fibroids prior to embolization versus managing any complications that may arise after the embolization.  Patient wants to pursue uterine artery embolization and would like to schedule the procedure near the end of the summer.  Patient would also like to have a face-to-face meeting prior to the procedure since all of our encounters have been via the telephone.  We will schedule a face-to-face meeting prior to the procedure.  Will contact Dr. Garwin Brothers and get her opinion about the intracavitary fibroids.

## 2019-08-21 ENCOUNTER — Ambulatory Visit
Admission: RE | Admit: 2019-08-21 | Discharge: 2019-08-21 | Disposition: A | Discharge status: Home / Self Care | Payer: 59 | Source: Ambulatory Visit | Attending: Diagnostic Radiology | Admitting: Diagnostic Radiology

## 2019-08-21 ENCOUNTER — Encounter: Payer: Self-pay | Admitting: *Deleted

## 2019-08-21 DIAGNOSIS — D649 Anemia, unspecified: Secondary | ICD-10-CM | POA: Diagnosis not present

## 2019-08-21 DIAGNOSIS — D250 Submucous leiomyoma of uterus: Secondary | ICD-10-CM

## 2019-08-21 DIAGNOSIS — N920 Excessive and frequent menstruation with regular cycle: Secondary | ICD-10-CM | POA: Diagnosis not present

## 2019-08-21 DIAGNOSIS — D259 Leiomyoma of uterus, unspecified: Secondary | ICD-10-CM | POA: Diagnosis not present

## 2019-08-21 HISTORY — PX: IR RADIOLOGIST EVAL & MGMT: IMG5224

## 2019-08-21 NOTE — Progress Notes (Signed)
Chief Complaint: Patient was seen in consultation today to review pelvic MRI findings.  Referring Physician(s): Servando Salina, MD  History of Present Illness: Lori Lara is a 44 y.o. female with uterine fibroids, menorrhagia and anemia.  Patient is being evaluated for uterine artery embolization.  Patient had a pelvic MRI that demonstrated multiple enhancing fibroids.  She has at least 2 small intracavitary fibroids that could potentially be a problem after embolization.  Dr. Garwin Brothers and I have communicated about these intracavitary fibroids and we feel comfortable proceeding with the embolization procedure and dealing with the intracavitary fibroids if they become a problem.  Past Medical History:  Diagnosis Date  . Anemia   . Vitamin D deficiency     Past Surgical History:  Procedure Laterality Date  . IR RADIOLOGIST EVAL & MGMT  07/11/2019    Allergies: Patient has no known allergies.  Medications: Prior to Admission medications   Medication Sig Start Date End Date Taking? Authorizing Provider  doxycycline (VIBRA-TABS) 100 MG tablet Take 1 tablet (100 mg total) by mouth 2 (two) times daily. 04/04/19   Elby Showers, MD  ergocalciferol (DRISDOL) 1.25 MG (50000 UT) capsule Take 1 capsule (50,000 Units total) by mouth once a week. 10/18/18   Elby Showers, MD  ferrous sulfate 220 (44 Fe) MG/5ML solution Take 220 mg by mouth daily with breakfast.    [provider]     No family history on file.  Social History   Socioeconomic History  . Marital status: Single    Spouse name: Not on file  . Number of children: Not on file  . Years of education: Not on file  . Highest education level: Not on file  Occupational History  . Not on file  Tobacco Use  . Smoking status: Never Smoker  . Smokeless tobacco: Never Used  Substance and Sexual Activity  . Alcohol use: No  . Drug use: No  . Sexual activity: Not on file  Other Topics Concern  . Not  on file  Social History Narrative  . Not on file   Social Determinants of Health   Financial Resource Strain:   . Difficulty of Paying Living Expenses:   Food Insecurity:   . Worried About Charity fundraiser in the Last Year:   . Arboriculturist in the Last Year:   Transportation Needs:   . Film/video editor (Medical):   Marland Kitchen Lack of Transportation (Non-Medical):   Physical Activity:   . Days of Exercise per Week:   . Minutes of Exercise per Session:   Stress:   . Feeling of Stress :   Social Connections:   . Frequency of Communication with Friends and Family:   . Frequency of Social Gatherings with Friends and Family:   . Attends Religious Services:   . Active Member of Clubs or Organizations:   . Attends Archivist Meetings:   Marland Kitchen Marital Status:      Review of Systems  Vital Signs: There were no vitals taken for this visit.  Physical Exam Constitutional:      General: She is not in acute distress.    Appearance: She is not ill-appearing.  Neurological:     Mental Status: She is alert.        Imaging: MR PELVIS W WO CONTRAST  Result Date: 07/27/2019 CLINICAL DATA:  Uterine fibroids. Preprocedural evaluation for uterine artery embolization. EXAM: MRI PELVIS WITHOUT AND WITH CONTRAST TECHNIQUE: Multiplanar multisequence  MR imaging of the pelvis was performed both before and after administration of intravenous contrast. CONTRAST:  31mL GADAVIST GADOBUTROL 1 MMOL/ML IV SOLN COMPARISON:  None. FINDINGS: Lower Urinary Tract: No bladder or urethral abnormality identified. Bowel:  Unremarkable visualized pelvic bowel loops. Vascular/Lymphatic: No pathologically enlarged lymph nodes or other significant abnormality. Reproductive: -- Uterus: Measures 14.9 x 8.8 x 9.6 cm (volume = 660 cm^3). Numerous fibroids are seen which involve the uterus diffusely. These range in size from less than 1 cm to 5.3 cm in maximum diameter. These fibroids are mostly intramural and  subserosal in location. -- Intracavitary fibroids: 2 small intracavitary fibroids are seen upper portion of the endometrial cavity which measure 1.6 cm and 1.4 cm in maximum diameter. -- Pedunculated fibroids: None. -- Fibroid contrast enhancement: All fibroids show contrast enhancement, without significant degeneration/devascularization. -- Right ovary:  Appears normal.  No mass identified. -- Left ovary:  Appears normal.  No mass identified. Other: No abnormal free fluid. Musculoskeletal:  Unremarkable. IMPRESSION: 1. Diffuse uterine involvement by fibroids, as described above. Two small intracavitary fibroids noted in the superior portion of the endometrial cavity. No pedunculated fibroids identified. 2. Normal appearance of both ovaries. No adnexal mass identified. Electronically Signed   By: Marlaine Hind M.D.   On: 07/27/2019 14:05    Labs:  CBC: Recent Labs    09/07/18 1655 10/12/18 1230 02/23/19 0828 06/01/19 1447  WBC 6.7 6.6 7.5 6.8  HGB 10.4* 10.2* 8.1* 9.5*  HCT 33.6* 33.6* 29.1* 31.6*  PLT 329 384 430* 387    COAGS: No results for input(s): INR, APTT in the last 8760 hours.  BMP: Recent Labs    10/12/18 1230 02/23/19 0828  NA 138 137  K 4.2 3.7  CL 105 107  CO2 24 24  GLUCOSE 75 101*  BUN 10 10  CALCIUM 9.2 8.2*  CREATININE 0.79 0.69  GFRNONAA 92 >60  GFRAA 106 >60    LIVER FUNCTION TESTS: Recent Labs    10/12/18 1230 02/23/19 0828  BILITOT 0.6 0.5  AST 14 14*  ALT 10 11  ALKPHOS  --  50  PROT 6.5 6.6  ALBUMIN  --  3.7    TUMOR MARKERS: No results for input(s): AFPTM, CEA, CA199, CHROMGRNA in the last 8760 hours.  Assessment and Plan:  44 year old with uterine fibroids, menorrhagia and anemia.  Patient is a candidate for uterine artery embolization procedure.  We reviewed her pelvic MRI findings and discussed the intracavitary fibroids.  Patient understands that these intracavitary fibroids could potentially be a problem with regards to chronic  discharge or fibroid expulsion.  Patient's gynecologist Dr. Garwin Brothers is aware of these findings and comfortable with proceeding with the fibroid embolization procedure.  We reviewed the embolization procedure in depth and the post procedure expectations.  We discussed radial versus femoral artery access.  Patient would prefer femoral access.  After review of the imaging and discussion of the procedure, patient would like to schedule the uterine artery embolization.  Patient would like to schedule the procedure for a week in August.  We will have the schedulers contact the patient and set up the procedure.   Electronically Signed: Burman Riis 08/21/2019, 3:26 PM   I spent a total of    15 Minutes in face to face in clinical consultation, greater than 50% of which was counseling/coordinating care for menorrhagia and uterine fibroids.   patient ID: Lori Lara, female   DOB: 25-Dec-1975, 44 y.o.   MRN: [id number]

## 2019-08-30 ENCOUNTER — Other Ambulatory Visit (HOSPITAL_COMMUNITY): Payer: Self-pay | Admitting: Diagnostic Radiology

## 2019-08-30 DIAGNOSIS — D259 Leiomyoma of uterus, unspecified: Secondary | ICD-10-CM

## 2019-10-01 ENCOUNTER — Other Ambulatory Visit: Payer: Self-pay | Admitting: Radiology

## 2019-10-02 ENCOUNTER — Other Ambulatory Visit (HOSPITAL_COMMUNITY)
Admission: RE | Admit: 2019-10-02 | Discharge: 2019-10-02 | Disposition: A | Discharge status: Home / Self Care | Payer: 59 | Source: Ambulatory Visit | Attending: Diagnostic Radiology | Admitting: Diagnostic Radiology

## 2019-10-02 DIAGNOSIS — Z20822 Contact with and (suspected) exposure to covid-19: Secondary | ICD-10-CM | POA: Diagnosis not present

## 2019-10-02 DIAGNOSIS — Z01812 Encounter for preprocedural laboratory examination: Secondary | ICD-10-CM | POA: Diagnosis not present

## 2019-10-02 LAB — SARS CORONAVIRUS 2 (TAT 6-24 HRS): SARS Coronavirus 2: NEGATIVE

## 2019-10-03 ENCOUNTER — Observation Stay (HOSPITAL_COMMUNITY)
Admission: RE | Admit: 2019-10-03 | Discharge: 2019-10-04 | Disposition: A | Discharge status: Home / Self Care | Payer: 59 | Source: Ambulatory Visit | Attending: Diagnostic Radiology | Admitting: Diagnostic Radiology

## 2019-10-03 ENCOUNTER — Ambulatory Visit (HOSPITAL_COMMUNITY)
Admission: RE | Admit: 2019-10-03 | Discharge: 2019-10-03 | Disposition: A | Discharge status: Home / Self Care | Payer: 59 | Source: Ambulatory Visit | Attending: Diagnostic Radiology | Admitting: Diagnostic Radiology

## 2019-10-03 ENCOUNTER — Other Ambulatory Visit: Payer: Self-pay

## 2019-10-03 ENCOUNTER — Encounter (HOSPITAL_COMMUNITY): Payer: Self-pay

## 2019-10-03 DIAGNOSIS — D649 Anemia, unspecified: Secondary | ICD-10-CM | POA: Insufficient documentation

## 2019-10-03 DIAGNOSIS — N920 Excessive and frequent menstruation with regular cycle: Secondary | ICD-10-CM | POA: Insufficient documentation

## 2019-10-03 DIAGNOSIS — D259 Leiomyoma of uterus, unspecified: Secondary | ICD-10-CM | POA: Diagnosis not present

## 2019-10-03 HISTORY — PX: IR ANGIOGRAM SELECTIVE EACH ADDITIONAL VESSEL: IMG667

## 2019-10-03 HISTORY — PX: IR EMBO TUMOR ORGAN ISCHEMIA INFARCT INC GUIDE ROADMAPPING: IMG5449

## 2019-10-03 HISTORY — PX: IR US GUIDE VASC ACCESS RIGHT: IMG2390

## 2019-10-03 HISTORY — PX: IR ANGIOGRAM PELVIS SELECTIVE OR SUPRASELECTIVE: IMG661

## 2019-10-03 LAB — CBC WITH DIFFERENTIAL/PLATELET
Abs Immature Granulocytes: 0.02 10*3/uL (ref 0.00–0.07)
Basophils Absolute: 0.1 10*3/uL (ref 0.0–0.1)
Basophils Relative: 2 %
Eosinophils Absolute: 0.1 10*3/uL (ref 0.0–0.5)
Eosinophils Relative: 1 %
HCT: 25.4 % — ABNORMAL LOW (ref 36.0–46.0)
Hemoglobin: 6.8 g/dL — CL (ref 12.0–15.0)
Immature Granulocytes: 0 %
Lymphocytes Relative: 24 %
Lymphs Abs: 1.3 10*3/uL (ref 0.7–4.0)
MCH: 18.1 pg — ABNORMAL LOW (ref 26.0–34.0)
MCHC: 26.8 g/dL — ABNORMAL LOW (ref 30.0–36.0)
MCV: 67.7 fL — ABNORMAL LOW (ref 80.0–100.0)
Monocytes Absolute: 0.4 10*3/uL (ref 0.1–1.0)
Monocytes Relative: 7 %
Neutro Abs: 3.6 10*3/uL (ref 1.7–7.7)
Neutrophils Relative %: 66 %
Platelets: 293 10*3/uL (ref 150–400)
RBC: 3.75 MIL/uL — ABNORMAL LOW (ref 3.87–5.11)
RDW: 17.2 % — ABNORMAL HIGH (ref 11.5–15.5)
WBC: 5.5 10*3/uL (ref 4.0–10.5)
nRBC: 0 % (ref 0.0–0.2)

## 2019-10-03 LAB — BASIC METABOLIC PANEL
Anion gap: 6 (ref 5–15)
BUN: 10 mg/dL (ref 6–20)
CO2: 22 mmol/L (ref 22–32)
Calcium: 8.7 mg/dL — ABNORMAL LOW (ref 8.9–10.3)
Chloride: 108 mmol/L (ref 98–111)
Creatinine, Ser: 0.64 mg/dL (ref 0.44–1.00)
GFR calc Af Amer: >60 mL/min (ref >60)
GFR calc non Af Amer: >60 mL/min (ref >60)
Glucose, Bld: 91 mg/dL (ref 70–99)
Potassium: 3.8 mmol/L (ref 3.5–5.1)
Sodium: 136 mmol/L (ref 135–145)

## 2019-10-03 LAB — HCG, SERUM, QUALITATIVE: Preg, Serum: NEGATIVE

## 2019-10-03 LAB — TYPE AND SCREEN
ABO/RH(D): O POS
Antibody Screen: NEGATIVE

## 2019-10-03 LAB — PROTIME-INR
INR: 1 (ref 0.8–1.2)
Prothrombin Time: 12.3 seconds (ref 11.4–15.2)

## 2019-10-03 LAB — ABO/RH: ABO/RH(D): O POS

## 2019-10-03 MED ORDER — LIDOCAINE HCL 1 % IJ SOLN
INTRAMUSCULAR | Status: AC
  Filled 2019-10-03: qty 20

## 2019-10-03 MED ORDER — DIPHENHYDRAMINE HCL 12.5 MG/5ML PO ELIX
12.5000 mg | ORAL_SOLUTION | Freq: Four times a day (QID) | ORAL | Status: DC | PRN
  Filled 2019-10-03: qty 5

## 2019-10-03 MED ORDER — SODIUM CHLORIDE 0.9% FLUSH: 9.0000 mL | INTRAVENOUS | Status: DC | PRN

## 2019-10-03 MED ORDER — MIDAZOLAM HCL 2 MG/2ML IJ SOLN
INTRAMUSCULAR | Status: AC | PRN
  Administered 2019-10-03: 1 mg via INTRAVENOUS
  Administered 2019-10-03: 0.5 mg via INTRAVENOUS
  Administered 2019-10-03: 1 mg via INTRAVENOUS
  Administered 2019-10-03: 0.5 mg via INTRAVENOUS
  Administered 2019-10-03: 1 mg via INTRAVENOUS

## 2019-10-03 MED ORDER — SODIUM CHLORIDE 0.9% FLUSH: 3.0000 mL | INTRAVENOUS | Status: DC | PRN

## 2019-10-03 MED ORDER — KETOROLAC TROMETHAMINE 30 MG/ML IJ SOLN
INTRAMUSCULAR | Status: AC
  Filled 2019-10-03: qty 1

## 2019-10-03 MED ORDER — KETOROLAC TROMETHAMINE 30 MG/ML IJ SOLN
30.0000 mg | INTRAMUSCULAR | Status: AC
  Administered 2019-10-03: 30 mg via INTRAVENOUS
  Filled 2019-10-03: qty 1

## 2019-10-03 MED ORDER — MIDAZOLAM HCL 2 MG/2ML IJ SOLN
INTRAMUSCULAR | Status: AC
  Filled 2019-10-03: qty 6

## 2019-10-03 MED ORDER — LIDOCAINE HCL (PF) 1 % IJ SOLN
INTRAMUSCULAR | Status: AC | PRN
  Administered 2019-10-03: 10 mL via INTRADERMAL

## 2019-10-03 MED ORDER — FENTANYL CITRATE (PF) 100 MCG/2ML IJ SOLN
INTRAMUSCULAR | Status: AC
Start: 2019-10-03 — End: 2019-10-04
  Filled 2019-10-03: qty 4

## 2019-10-03 MED ORDER — SODIUM CHLORIDE 0.9% FLUSH
3.0000 mL | Freq: Two times a day (BID) | INTRAVENOUS | Status: DC
  Administered 2019-10-03: 3 mL via INTRAVENOUS

## 2019-10-03 MED ORDER — IOHEXOL 300 MG/ML  SOLN
100.0000 mL | Freq: Once | INTRAMUSCULAR | Status: AC | PRN
  Administered 2019-10-03: 50 mL via INTRA_ARTERIAL

## 2019-10-03 MED ORDER — SODIUM CHLORIDE 0.9 % IV SOLN: 250.0000 mL | INTRAVENOUS | Status: DC | PRN

## 2019-10-03 MED ORDER — DOCUSATE SODIUM 100 MG PO CAPS
100.0000 mg | ORAL_CAPSULE | Freq: Two times a day (BID) | ORAL | Status: DC
  Administered 2019-10-03 – 2019-10-04 (×2): 100 mg via ORAL
  Filled 2019-10-03 (×2): qty 1

## 2019-10-03 MED ORDER — HYDROMORPHONE 1 MG/ML IV SOLN
INTRAVENOUS | Status: DC
  Administered 2019-10-03: 3.6 mg via INTRAVENOUS
  Administered 2019-10-03: 30 mg via INTRAVENOUS
  Administered 2019-10-04: 1.2 mg via INTRAVENOUS
  Administered 2019-10-04: 0.3 mg via INTRAVENOUS

## 2019-10-03 MED ORDER — FERROUS SULFATE 220 (44 FE) MG/5ML PO ELIX
220.0000 mg | ORAL_SOLUTION | Freq: Every day | ORAL | Status: DC
  Filled 2019-10-03: qty 5

## 2019-10-03 MED ORDER — ONDANSETRON HCL 4 MG/2ML IJ SOLN
4.0000 mg | Freq: Four times a day (QID) | INTRAMUSCULAR | Status: DC | PRN
  Administered 2019-10-03: 4 mg via INTRAVENOUS

## 2019-10-03 MED ORDER — VITAMIN D (ERGOCALCIFEROL) 1.25 MG (50000 UNIT) PO CAPS
50000.0000 [IU] | ORAL_CAPSULE | ORAL | Status: DC
  Administered 2019-10-04: 50000 [IU] via ORAL
  Filled 2019-10-03: qty 1

## 2019-10-03 MED ORDER — NALOXONE HCL 0.4 MG/ML IJ SOLN: 0.4000 mg | INTRAMUSCULAR | Status: DC | PRN

## 2019-10-03 MED ORDER — PROMETHAZINE HCL 25 MG PO TABS
25.0000 mg | ORAL_TABLET | Freq: Three times a day (TID) | ORAL | Status: DC | PRN
  Administered 2019-10-04: 25 mg via ORAL
  Filled 2019-10-03: qty 1

## 2019-10-03 MED ORDER — IOHEXOL 300 MG/ML  SOLN
100.0000 mL | Freq: Once | INTRAMUSCULAR | Status: AC | PRN
  Administered 2019-10-03: 25 mL via INTRA_ARTERIAL

## 2019-10-03 MED ORDER — SODIUM CHLORIDE 0.9 % IV SOLN: INTRAVENOUS | Status: DC

## 2019-10-03 MED ORDER — PROMETHAZINE HCL 25 MG RE SUPP: 25.0000 mg | Freq: Three times a day (TID) | RECTAL | Status: DC | PRN

## 2019-10-03 MED ORDER — CEFAZOLIN SODIUM-DEXTROSE 2-4 GM/100ML-% IV SOLN: 2.0000 g | INTRAVENOUS | Status: AC

## 2019-10-03 MED ORDER — KETOROLAC TROMETHAMINE 30 MG/ML IJ SOLN
30.0000 mg | Freq: Four times a day (QID) | INTRAMUSCULAR | Status: DC
  Administered 2019-10-03 – 2019-10-04 (×3): 30 mg via INTRAVENOUS
  Filled 2019-10-03 (×3): qty 1

## 2019-10-03 MED ORDER — CEFAZOLIN SODIUM-DEXTROSE 2-4 GM/100ML-% IV SOLN
INTRAVENOUS | Status: AC
  Administered 2019-10-03: 2 g via INTRAVENOUS
  Filled 2019-10-03: qty 100

## 2019-10-03 MED ORDER — IOHEXOL 300 MG/ML  SOLN
100.0000 mL | Freq: Once | INTRAMUSCULAR | Status: AC | PRN
  Administered 2019-10-03: 41 mL via INTRA_ARTERIAL

## 2019-10-03 MED ORDER — DIPHENHYDRAMINE HCL 50 MG/ML IJ SOLN: 12.5000 mg | Freq: Four times a day (QID) | INTRAMUSCULAR | Status: DC | PRN

## 2019-10-03 MED ORDER — FENTANYL CITRATE (PF) 100 MCG/2ML IJ SOLN
INTRAMUSCULAR | Status: AC | PRN
  Administered 2019-10-03 (×4): 50 ug via INTRAVENOUS

## 2019-10-03 MED ORDER — ONDANSETRON HCL 4 MG/2ML IJ SOLN
4.0000 mg | Freq: Four times a day (QID) | INTRAMUSCULAR | Status: DC | PRN
  Filled 2019-10-03: qty 2

## 2019-10-03 MED ORDER — FERROUS SULFATE 300 (60 FE) MG/5ML PO SYRP
300.0000 mg | ORAL_SOLUTION | Freq: Every day | ORAL | Status: DC
  Administered 2019-10-04: 300 mg via ORAL
  Filled 2019-10-03: qty 5

## 2019-10-03 NOTE — Procedures (Signed)
Interventional Radiology Procedure:   Indications: Fibroids and menorrhagia  Procedure: Bilateral uterine artery embolization  Findings: Successful embolization of bilateral uterine arteries.  Collateral flow to left ovary/adnexa.   Complications: None      EBL: less than 20 ml  Plan: Bedrest 4 hours, keep overnight for pain control.     Rudell Ortman R. Anselm Pancoast, MD  Pager: [phone]

## 2019-10-03 NOTE — H&P (Signed)
Referring Physician(s): [name],S  Supervising Physician: Markus Daft  Patient Status:  [facility] OP TBA  Chief Complaint: Symptomatic uterine fibroids   Subjective: Patient familiar to IR service from tele consultation with Dr. Anselm Pancoast on 07/11/2019 to discuss treatment options for symptomatic uterine fibroids.  She was deemed an appropriate candidate for bilateral uterine artery embolization and presents today for the procedure.  Of note, patient does have intracavitary fibroids as well.  She currently denies fever, headache, chest pain, dyspnea, cough, back pain, nausea, vomiting.  She does have intermittent pelvic cramping as well as menorrhagia.  Past Medical History:  Diagnosis Date  . Anemia   . Vitamin D deficiency    Past Surgical History:  Procedure Laterality Date  . IR RADIOLOGIST EVAL & MGMT  07/11/2019  . IR RADIOLOGIST EVAL & MGMT  08/21/2019      Allergies: Patient has no known allergies.  Medications: Prior to Admission medications   Medication Sig Start Date End Date Taking? Authorizing Provider  ergocalciferol (DRISDOL) 1.25 MG (50000 UT) capsule Take 1 capsule (50,000 Units total) by mouth once a week. 10/18/18  Yes Baxley, [name], MD  ferrous sulfate 220 (44 Fe) MG/5ML solution Take 220 mg by mouth daily with breakfast.   Yes [provider]  doxycycline (VIBRA-TABS) 100 MG tablet Take 1 tablet (100 mg total) by mouth 2 (two) times daily. 04/04/19   Elby Showers, MD     Vital Signs: BP 121/73   Pulse 90   Temp 99 F (37.2 C) (Oral)   Resp 18   LMP 09/29/2019   SpO2 100%   Physical Exam awake, alert.  Chest clear to auscultation bilaterally.  Heart with regular rate and rhythm.  Abdomen soft, positive bowel sounds, mildly tender anterior pelvic region to palpation, fibroid uterus.  No significant lower extremity edema, intact distal pulses.  Imaging: No results found.  Labs:  CBC: Recent Labs    10/12/18 1230 02/23/19 0828 06/01/19 1447    WBC 6.6 7.5 6.8  HGB 10.2* 8.1* 9.5*  HCT 33.6* 29.1* 31.6*  PLT 384 430* 387    COAGS: No results for input(s): INR, APTT in the last 8760 hours.  BMP: Recent Labs    10/12/18 1230 02/23/19 0828  NA 138 137  K 4.2 3.7  CL 105 107  CO2 24 24  GLUCOSE 75 101*  BUN 10 10  CALCIUM 9.2 8.2*  CREATININE 0.79 0.69  GFRNONAA 92 >60  GFRAA 106 >60    LIVER FUNCTION TESTS: Recent Labs    10/12/18 1230 02/23/19 0828  BILITOT 0.6 0.5  AST 14 14*  ALT 10 11  ALKPHOS  --  50  PROT 6.5 6.6  ALBUMIN  --  3.7    Assessment and Plan: Patient with history of symptomatic uterine fibroids, some of which are intracavitary;  Underwent tele consultation with Dr. Anselm Pancoast on 07/19/2019 to discuss treatment options and was deemed an appropriate candidate for bilateral uterine artery embolization.  She presents today for the procedure.Risks and benefits of procedure were discussed with the patient including, but not limited to bleeding, infection, vascular injury or contrast induced renal failure.  This interventional procedure involves the use of X-rays and because of the nature of the planned procedure, it is possible that we will have prolonged use of X-ray fluoroscopy.  Potential radiation risks to you include (but are not limited to) the following: - A slightly elevated risk for cancer  several years later in life.  This risk is typically less than 0.5% percent. This risk is low in comparison to the normal incidence of human cancer, which is 33% for women and 50% for men according to the [employer]. - Radiation induced injury can include skin redness, resembling a rash, tissue breakdown / ulcers and hair loss (which can be temporary or permanent).   The likelihood of either of these occurring depends on the difficulty of the procedure and whether you are sensitive to radiation due to previous procedures, disease, or genetic conditions.   IF your procedure requires a  prolonged use of radiation, you will be notified and given written instructions for further action.  It is your responsibility to monitor the irradiated area for the 2 weeks following the procedure and to notify your physician if you are concerned that you have suffered a radiation induced injury.    All of the patient's questions were answered, patient is agreeable to proceed.  Consent signed and in chart. COVID -19 neg  Post procedure patient will be admitted to the hospital for overnight observation for pain control   LABS PENDING  Electronically Signed: D. Rowe Robert, PA-C 10/03/2019, 12:55 PM   I spent a total of 30 minutes at the the patient's bedside AND on the patient's hospital floor or unit, greater than 50% of which was counseling/coordinating care for bilateral uterine artery embolization

## 2019-10-04 ENCOUNTER — Telehealth: Payer: Self-pay

## 2019-10-04 DIAGNOSIS — Z9889 Other specified postprocedural states: Secondary | ICD-10-CM | POA: Diagnosis not present

## 2019-10-04 DIAGNOSIS — N920 Excessive and frequent menstruation with regular cycle: Secondary | ICD-10-CM | POA: Diagnosis not present

## 2019-10-04 DIAGNOSIS — D259 Leiomyoma of uterus, unspecified: Secondary | ICD-10-CM | POA: Diagnosis not present

## 2019-10-04 MED ORDER — DOCUSATE SODIUM 100 MG PO CAPS: 100.0000 mg | ORAL_CAPSULE | Freq: Every day | ORAL | 0 refills | Status: DC

## 2019-10-04 MED ORDER — OXYCODONE-ACETAMINOPHEN 5-325 MG PO TABS
1.0000 | ORAL_TABLET | ORAL | 0 refills | Status: DC | PRN
Start: 2019-10-04 — End: 2019-10-22

## 2019-10-04 MED ORDER — IBUPROFEN 600 MG PO TABS: ORAL_TABLET | ORAL | 0 refills | Status: DC

## 2019-10-04 MED ORDER — OXYCODONE-ACETAMINOPHEN 5-325 MG PO TABS
1.0000 | ORAL_TABLET | ORAL | Status: DC | PRN
  Administered 2019-10-04: 1 via ORAL
  Filled 2019-10-04: qty 1

## 2019-10-04 MED ORDER — ONDANSETRON HCL 8 MG PO TABS: 8.0000 mg | ORAL_TABLET | Freq: Three times a day (TID) | ORAL | 0 refills | Status: DC | PRN

## 2019-10-04 MED FILL — IBUPROFEN 600 MG TABLET: 600 | 8 days supply | Qty: 30 | Fill #0

## 2019-10-04 MED FILL — OXYCODONE-APAP 5-325MG: 5-325 | 5 days supply | Qty: 30 | Fill #0

## 2019-10-04 MED FILL — ONDANSETRON HCL 8 MG TABLET: 8 | 7 days supply | Qty: 20 | Fill #0

## 2019-10-04 NOTE — Telephone Encounter (Signed)
-----   Message from Wyatt Portela, MD sent at 10/04/2019  2:46 PM EDT ----- [place]. Noted ----- Message ----- From: Kennedy Bucker, LPN Sent: [id number]   2:25 PM EDT To: Wyatt Portela, MD  Per Deatra Canter PA in IR, patient had a bilateral uterine artery embolization yesterday. Her hemoglobin before the procedure was 6.8 Deatra Canter states patient had minimal blood loss during the procedure. Deatra Canter states she just wanted to make you aware. Thank you.  Maudie Mercury

## 2019-10-04 NOTE — Progress Notes (Signed)
Pt discharged home in stable condition. Discharge instructions given. Pt verbalized understanding. No immediate questions or concerns at this time. Pt discharged from unit via wheelchair.  

## 2019-10-04 NOTE — Telephone Encounter (Signed)
See note below

## 2019-10-04 NOTE — Discharge Summary (Signed)
Patient ID: Lori Lara MRN: [id number] DOB/AGE: 1975-09-23 44 y.o.  Admit date: 10/03/2019 Discharge date: 10/04/2019  Supervising Physician: Daryll Brod  Patient Status: [facility]  Admission Diagnoses: Uterine leiomyoma Menorrhagia  Discharge Diagnoses:  Active Problems:   Uterine leiomyoma   Menorrhagia   Discharged Condition: stable  Hospital Course:   Patient presented to [facility] 10/03/2019 for an image-guided bilateral uterine artery embolization via right femoral approach by Dr. Anselm Pancoast. Procedure occurred without major complications and patient was transferred to floor in stable condition (VSS, right femoral puncture site stable) for overnight observation. Patient did have moderate pain overnight, managed with IV pain medications.  Patient was switched from IV to PO pain medications this AM. She is awake and alert sitting in bed. Complains of pelvic pain, rated 4/10 at this time. States pain is manageable with oral pain medications. Right femoral puncture site stable, distal pulses intact. Denies N/V. Is tolerating PO intake. Plan to discharge home today and follow-up with Dr. Anselm Pancoast for televisit 1 month after discharge.  Pre-procedure labs revealed hgb 6.8- patient asymptomatic at this time. Discussed with Dr. Anselm Pancoast who recommends making patient's hematologist aware. Spoke with Dr. Hazeline Junker RN to inform of above.  Discharge medication regimen (these medications have been e-prescribed to your pharmacy): 1- Percocet 5-325 mg tablets; take one tablet by mouth once every 4 hour as needed for moderate/severe pain. 2- Ibuprofen 600 mg tablets; take one tablet by mouth once every 6 hours for 5  Days, then once every 6 hours as needed for moderate/severe pain. 3- Zofran 8 mg tablets; take one tablet by mouth once every 8 hours as needed for nausea/vomiting. 4- Colace 100 mg tablets; take one tablet by mouth once daily for 7 days.   Consults: None  Significant  Diagnostic Studies: IR Angiogram Pelvis Selective Or Supraselective  Result Date: 10/03/2019 INDICATION: 44 year old with uterine fibroids, menorrhagia and anemia. Plan for bilateral uterine artery embolization procedure. EXAM: 1. Pelvic angiography with selective angiography of the bilateral internal iliac arteries and bilateral uterine arteries 2. Particle embolization of bilateral uterine arteries 3. Ultrasound guidance for vascular access MEDICATIONS: Ancef 2 g. The antibiotic was administered within 1 hour of the procedure ANESTHESIA/SEDATION: Fentanyl 200 mcg IV; Versed 4.0 mg IV Moderate Sedation Time:  93 minutes The patient was continuously monitored during the procedure by the interventional radiology nurse under my direct supervision. CONTRAST:  116 mL Omnipaque 300 FLUOROSCOPY TIME:  Fluoroscopy Time: 26 minutes, 36 seconds, 4481 mGy COMPLICATIONS: None immediate. PROCEDURE: Informed consent was obtained from the patient following explanation of the procedure, risks, benefits and alternatives. The patient understands, agrees and consents for the procedure. All questions were addressed. A time out was performed prior to the initiation of the procedure. Maximal barrier sterile technique utilized including caps, mask, sterile gowns, sterile gloves, large sterile drape, hand hygiene, and Betadine prep. The patient was placed supine on the interventional table. The right groin was prepped and draped in a sterile fashion. Maximal barrier sterile technique was utilized including caps, mask, sterile gowns, sterile gloves, sterile drape, hand hygiene and skin antiseptic. Ultrasound confirmed a patent right common femoral artery. Ultrasound image was saved for documentation. The skin was anesthetized 1% lidocaine. Using ultrasound guidance, 21 gauge needle was directed in the right common femoral artery and a micropuncture dilator set was placed. The vascular access was upsized to a 5-French vascular sheath. A  Cobra catheter was used to cannulate the left common iliac artery and the left  internal iliac artery. A series of arteriograms were performed to identify the left uterine artery orfice. A Progreat microcatheter was advanced into the left uterine artery. Angiography was performed in the left uterine artery. Attempted to advance a catheter near the origin of vessel supplying the left ovary/adnexa. This vessel was so distal that I was concerned about causing spasm in the uterus artery which would complicate the embolization procedure. Therefore, catheter was pulled back towards the proximal aspect of the uterine artery. Two vials of Embospheres (500 - 700 micron) were injected through the microcatheter with fluoroscopic guidance. There was near complete stasis of the left uterine artery following administration of the particles. Waltman's loop would not easily advance into the right common iliac artery or right internal iliac artery. Therefore, the C2 catheter was removed. Mancel Bale uterine catheter was advanced over the bifurcation and the large reverse curve catheter was formed in the abdominal aorta. The catheter was pulled back into the right common iliac artery and used to cannulate the right internal iliac artery. Selective angiography was performed in the right internal iliac artery. Catheter was advanced to the origin of the right uterine artery. Additional angiography was performed. The microcatheter was advanced into the right uterine artery and a series of angiograms were performed. Three vials of Embospheres (500-700 micron) were injected through the microcatheter with fluoroscopic guidance. There was near complete stasis of the right uterine artery at the end of the procedure. The microcatheter was removed. The Roberts catheter was straightened out over the aortic bifurcation and removed over a wire. Angiogram was performed through the right groin sheath. The right groin sheath was removed using an  Angio-Seal closure device. Right groin hemostasis at the end of the procedure. FINDINGS: Bilateral large uterine arteries. A small vessel coming off the distal left uterine artery appeared to be supplying the left ovary/adnexa. Attempted to advance a catheter near the origin of this left ovarian/adnexal branch. Catheter could not be successfully advanced to this origin due to tortuosity of the left uterine artery and the distal location of this feeding branch. Both uterine arteries were successfully embolized to near complete stasis. IMPRESSION: Successful bilateral uterine artery embolization procedure. Electronically Signed   By: Markus Daft M.D.   On: 10/03/2019 17:45   IR Angiogram Selective Each Additional Vessel  Result Date: 10/03/2019 INDICATION: 44 year old with uterine fibroids, menorrhagia and anemia. Plan for bilateral uterine artery embolization procedure. EXAM: 1. Pelvic angiography with selective angiography of the bilateral internal iliac arteries and bilateral uterine arteries 2. Particle embolization of bilateral uterine arteries 3. Ultrasound guidance for vascular access MEDICATIONS: Ancef 2 g. The antibiotic was administered within 1 hour of the procedure ANESTHESIA/SEDATION: Fentanyl 200 mcg IV; Versed 4.0 mg IV Moderate Sedation Time:  93 minutes The patient was continuously monitored during the procedure by the interventional radiology nurse under my direct supervision. CONTRAST:  116 mL Omnipaque 300 FLUOROSCOPY TIME:  Fluoroscopy Time: 26 minutes, 36 seconds, 4166 mGy COMPLICATIONS: None immediate. PROCEDURE: Informed consent was obtained from the patient following explanation of the procedure, risks, benefits and alternatives. The patient understands, agrees and consents for the procedure. All questions were addressed. A time out was performed prior to the initiation of the procedure. Maximal barrier sterile technique utilized including caps, mask, sterile gowns, sterile gloves, large  sterile drape, hand hygiene, and Betadine prep. The patient was placed supine on the interventional table. The right groin was prepped and draped in a sterile fashion. Maximal barrier sterile technique was utilized including  caps, mask, sterile gowns, sterile gloves, sterile drape, hand hygiene and skin antiseptic. Ultrasound confirmed a patent right common femoral artery. Ultrasound image was saved for documentation. The skin was anesthetized 1% lidocaine. Using ultrasound guidance, 21 gauge needle was directed in the right common femoral artery and a micropuncture dilator set was placed. The vascular access was upsized to a 5-French vascular sheath. A Cobra catheter was used to cannulate the left common iliac artery and the left internal iliac artery. A series of arteriograms were performed to identify the left uterine artery orfice. A Progreat microcatheter was advanced into the left uterine artery. Angiography was performed in the left uterine artery. Attempted to advance a catheter near the origin of vessel supplying the left ovary/adnexa. This vessel was so distal that I was concerned about causing spasm in the uterus artery which would complicate the embolization procedure. Therefore, catheter was pulled back towards the proximal aspect of the uterine artery. Two vials of Embospheres (500 - 700 micron) were injected through the microcatheter with fluoroscopic guidance. There was near complete stasis of the left uterine artery following administration of the particles. Waltman's loop would not easily advance into the right common iliac artery or right internal iliac artery. Therefore, the C2 catheter was removed. Mancel Bale uterine catheter was advanced over the bifurcation and the large reverse curve catheter was formed in the abdominal aorta. The catheter was pulled back into the right common iliac artery and used to cannulate the right internal iliac artery. Selective angiography was performed in the right  internal iliac artery. Catheter was advanced to the origin of the right uterine artery. Additional angiography was performed. The microcatheter was advanced into the right uterine artery and a series of angiograms were performed. Three vials of Embospheres (500-700 micron) were injected through the microcatheter with fluoroscopic guidance. There was near complete stasis of the right uterine artery at the end of the procedure. The microcatheter was removed. The Roberts catheter was straightened out over the aortic bifurcation and removed over a wire. Angiogram was performed through the right groin sheath. The right groin sheath was removed using an Angio-Seal closure device. Right groin hemostasis at the end of the procedure. FINDINGS: Bilateral large uterine arteries. A small vessel coming off the distal left uterine artery appeared to be supplying the left ovary/adnexa. Attempted to advance a catheter near the origin of this left ovarian/adnexal branch. Catheter could not be successfully advanced to this origin due to tortuosity of the left uterine artery and the distal location of this feeding branch. Both uterine arteries were successfully embolized to near complete stasis. IMPRESSION: Successful bilateral uterine artery embolization procedure. Electronically Signed   By: Markus Daft M.D.   On: 10/03/2019 17:45   IR Angiogram Selective Each Additional Vessel  Result Date: 10/03/2019 INDICATION: 44 year old with uterine fibroids, menorrhagia and anemia. Plan for bilateral uterine artery embolization procedure. EXAM: 1. Pelvic angiography with selective angiography of the bilateral internal iliac arteries and bilateral uterine arteries 2. Particle embolization of bilateral uterine arteries 3. Ultrasound guidance for vascular access MEDICATIONS: Ancef 2 g. The antibiotic was administered within 1 hour of the procedure ANESTHESIA/SEDATION: Fentanyl 200 mcg IV; Versed 4.0 mg IV Moderate Sedation Time:  93 minutes  The patient was continuously monitored during the procedure by the interventional radiology nurse under my direct supervision. CONTRAST:  116 mL Omnipaque 300 FLUOROSCOPY TIME:  Fluoroscopy Time: 26 minutes, 36 seconds, 6073 mGy COMPLICATIONS: None immediate. PROCEDURE: Informed consent was obtained from the patient following explanation  of the procedure, risks, benefits and alternatives. The patient understands, agrees and consents for the procedure. All questions were addressed. A time out was performed prior to the initiation of the procedure. Maximal barrier sterile technique utilized including caps, mask, sterile gowns, sterile gloves, large sterile drape, hand hygiene, and Betadine prep. The patient was placed supine on the interventional table. The right groin was prepped and draped in a sterile fashion. Maximal barrier sterile technique was utilized including caps, mask, sterile gowns, sterile gloves, sterile drape, hand hygiene and skin antiseptic. Ultrasound confirmed a patent right common femoral artery. Ultrasound image was saved for documentation. The skin was anesthetized 1% lidocaine. Using ultrasound guidance, 21 gauge needle was directed in the right common femoral artery and a micropuncture dilator set was placed. The vascular access was upsized to a 5-French vascular sheath. A Cobra catheter was used to cannulate the left common iliac artery and the left internal iliac artery. A series of arteriograms were performed to identify the left uterine artery orfice. A Progreat microcatheter was advanced into the left uterine artery. Angiography was performed in the left uterine artery. Attempted to advance a catheter near the origin of vessel supplying the left ovary/adnexa. This vessel was so distal that I was concerned about causing spasm in the uterus artery which would complicate the embolization procedure. Therefore, catheter was pulled back towards the proximal aspect of the uterine artery. Two  vials of Embospheres (500 - 700 micron) were injected through the microcatheter with fluoroscopic guidance. There was near complete stasis of the left uterine artery following administration of the particles. Waltman's loop would not easily advance into the right common iliac artery or right internal iliac artery. Therefore, the C2 catheter was removed. Mancel Bale uterine catheter was advanced over the bifurcation and the large reverse curve catheter was formed in the abdominal aorta. The catheter was pulled back into the right common iliac artery and used to cannulate the right internal iliac artery. Selective angiography was performed in the right internal iliac artery. Catheter was advanced to the origin of the right uterine artery. Additional angiography was performed. The microcatheter was advanced into the right uterine artery and a series of angiograms were performed. Three vials of Embospheres (500-700 micron) were injected through the microcatheter with fluoroscopic guidance. There was near complete stasis of the right uterine artery at the end of the procedure. The microcatheter was removed. The Roberts catheter was straightened out over the aortic bifurcation and removed over a wire. Angiogram was performed through the right groin sheath. The right groin sheath was removed using an Angio-Seal closure device. Right groin hemostasis at the end of the procedure. FINDINGS: Bilateral large uterine arteries. A small vessel coming off the distal left uterine artery appeared to be supplying the left ovary/adnexa. Attempted to advance a catheter near the origin of this left ovarian/adnexal branch. Catheter could not be successfully advanced to this origin due to tortuosity of the left uterine artery and the distal location of this feeding branch. Both uterine arteries were successfully embolized to near complete stasis. IMPRESSION: Successful bilateral uterine artery embolization procedure. Electronically Signed    By: Markus Daft M.D.   On: 10/03/2019 17:45   IR US Guide Vasc Access Right  Result Date: 10/03/2019 INDICATION: 44 year old with uterine fibroids, menorrhagia and anemia. Plan for bilateral uterine artery embolization procedure. EXAM: 1. Pelvic angiography with selective angiography of the bilateral internal iliac arteries and bilateral uterine arteries 2. Particle embolization of bilateral uterine arteries 3. Ultrasound guidance  for vascular access MEDICATIONS: Ancef 2 g. The antibiotic was administered within 1 hour of the procedure ANESTHESIA/SEDATION: Fentanyl 200 mcg IV; Versed 4.0 mg IV Moderate Sedation Time:  93 minutes The patient was continuously monitored during the procedure by the interventional radiology nurse under my direct supervision. CONTRAST:  116 mL Omnipaque 300 FLUOROSCOPY TIME:  Fluoroscopy Time: 26 minutes, 36 seconds, 0272 mGy COMPLICATIONS: None immediate. PROCEDURE: Informed consent was obtained from the patient following explanation of the procedure, risks, benefits and alternatives. The patient understands, agrees and consents for the procedure. All questions were addressed. A time out was performed prior to the initiation of the procedure. Maximal barrier sterile technique utilized including caps, mask, sterile gowns, sterile gloves, large sterile drape, hand hygiene, and Betadine prep. The patient was placed supine on the interventional table. The right groin was prepped and draped in a sterile fashion. Maximal barrier sterile technique was utilized including caps, mask, sterile gowns, sterile gloves, sterile drape, hand hygiene and skin antiseptic. Ultrasound confirmed a patent right common femoral artery. Ultrasound image was saved for documentation. The skin was anesthetized 1% lidocaine. Using ultrasound guidance, 21 gauge needle was directed in the right common femoral artery and a micropuncture dilator set was placed. The vascular access was upsized to a 5-French vascular  sheath. A Cobra catheter was used to cannulate the left common iliac artery and the left internal iliac artery. A series of arteriograms were performed to identify the left uterine artery orfice. A Progreat microcatheter was advanced into the left uterine artery. Angiography was performed in the left uterine artery. Attempted to advance a catheter near the origin of vessel supplying the left ovary/adnexa. This vessel was so distal that I was concerned about causing spasm in the uterus artery which would complicate the embolization procedure. Therefore, catheter was pulled back towards the proximal aspect of the uterine artery. Two vials of Embospheres (500 - 700 micron) were injected through the microcatheter with fluoroscopic guidance. There was near complete stasis of the left uterine artery following administration of the particles. Waltman's loop would not easily advance into the right common iliac artery or right internal iliac artery. Therefore, the C2 catheter was removed. Mancel Bale uterine catheter was advanced over the bifurcation and the large reverse curve catheter was formed in the abdominal aorta. The catheter was pulled back into the right common iliac artery and used to cannulate the right internal iliac artery. Selective angiography was performed in the right internal iliac artery. Catheter was advanced to the origin of the right uterine artery. Additional angiography was performed. The microcatheter was advanced into the right uterine artery and a series of angiograms were performed. Three vials of Embospheres (500-700 micron) were injected through the microcatheter with fluoroscopic guidance. There was near complete stasis of the right uterine artery at the end of the procedure. The microcatheter was removed. The Roberts catheter was straightened out over the aortic bifurcation and removed over a wire. Angiogram was performed through the right groin sheath. The right groin sheath was removed using an  Angio-Seal closure device. Right groin hemostasis at the end of the procedure. FINDINGS: Bilateral large uterine arteries. A small vessel coming off the distal left uterine artery appeared to be supplying the left ovary/adnexa. Attempted to advance a catheter near the origin of this left ovarian/adnexal branch. Catheter could not be successfully advanced to this origin due to tortuosity of the left uterine artery and the distal location of this feeding branch. Both uterine arteries were successfully embolized  to near complete stasis. IMPRESSION: Successful bilateral uterine artery embolization procedure. Electronically Signed   By: Markus Daft M.D.   On: 10/03/2019 17:45   IR EMBO TUMOR ORGAN ISCHEMIA INFARCT INC GUIDE ROADMAPPING  Result Date: 10/03/2019 INDICATION: 44 year old with uterine fibroids, menorrhagia and anemia. Plan for bilateral uterine artery embolization procedure. EXAM: 1. Pelvic angiography with selective angiography of the bilateral internal iliac arteries and bilateral uterine arteries 2. Particle embolization of bilateral uterine arteries 3. Ultrasound guidance for vascular access MEDICATIONS: Ancef 2 g. The antibiotic was administered within 1 hour of the procedure ANESTHESIA/SEDATION: Fentanyl 200 mcg IV; Versed 4.0 mg IV Moderate Sedation Time:  93 minutes The patient was continuously monitored during the procedure by the interventional radiology nurse under my direct supervision. CONTRAST:  116 mL Omnipaque 300 FLUOROSCOPY TIME:  Fluoroscopy Time: 26 minutes, 36 seconds, 7782 mGy COMPLICATIONS: None immediate. PROCEDURE: Informed consent was obtained from the patient following explanation of the procedure, risks, benefits and alternatives. The patient understands, agrees and consents for the procedure. All questions were addressed. A time out was performed prior to the initiation of the procedure. Maximal barrier sterile technique utilized including caps, mask, sterile gowns, sterile  gloves, large sterile drape, hand hygiene, and Betadine prep. The patient was placed supine on the interventional table. The right groin was prepped and draped in a sterile fashion. Maximal barrier sterile technique was utilized including caps, mask, sterile gowns, sterile gloves, sterile drape, hand hygiene and skin antiseptic. Ultrasound confirmed a patent right common femoral artery. Ultrasound image was saved for documentation. The skin was anesthetized 1% lidocaine. Using ultrasound guidance, 21 gauge needle was directed in the right common femoral artery and a micropuncture dilator set was placed. The vascular access was upsized to a 5-French vascular sheath. A Cobra catheter was used to cannulate the left common iliac artery and the left internal iliac artery. A series of arteriograms were performed to identify the left uterine artery orfice. A Progreat microcatheter was advanced into the left uterine artery. Angiography was performed in the left uterine artery. Attempted to advance a catheter near the origin of vessel supplying the left ovary/adnexa. This vessel was so distal that I was concerned about causing spasm in the uterus artery which would complicate the embolization procedure. Therefore, catheter was pulled back towards the proximal aspect of the uterine artery. Two vials of Embospheres (500 - 700 micron) were injected through the microcatheter with fluoroscopic guidance. There was near complete stasis of the left uterine artery following administration of the particles. Waltman's loop would not easily advance into the right common iliac artery or right internal iliac artery. Therefore, the C2 catheter was removed. Mancel Bale uterine catheter was advanced over the bifurcation and the large reverse curve catheter was formed in the abdominal aorta. The catheter was pulled back into the right common iliac artery and used to cannulate the right internal iliac artery. Selective angiography was performed in  the right internal iliac artery. Catheter was advanced to the origin of the right uterine artery. Additional angiography was performed. The microcatheter was advanced into the right uterine artery and a series of angiograms were performed. Three vials of Embospheres (500-700 micron) were injected through the microcatheter with fluoroscopic guidance. There was near complete stasis of the right uterine artery at the end of the procedure. The microcatheter was removed. The Roberts catheter was straightened out over the aortic bifurcation and removed over a wire. Angiogram was performed through the right groin sheath. The right groin sheath  was removed using an Angio-Seal closure device. Right groin hemostasis at the end of the procedure. FINDINGS: Bilateral large uterine arteries. A small vessel coming off the distal left uterine artery appeared to be supplying the left ovary/adnexa. Attempted to advance a catheter near the origin of this left ovarian/adnexal branch. Catheter could not be successfully advanced to this origin due to tortuosity of the left uterine artery and the distal location of this feeding branch. Both uterine arteries were successfully embolized to near complete stasis. IMPRESSION: Successful bilateral uterine artery embolization procedure. Electronically Signed   By: Markus Daft M.D.   On: 10/03/2019 17:45    Treatments: Image-guided endovascular bilateral uterine artery embolization via right femoral approach  Discharge Exam: Blood pressure (!) 142/80, pulse (!) 58, temperature 97.7 F (36.5 C), temperature source Oral, resp. rate 15, height 5\' 6"  (1.676 m), weight 255 lb (115.7 kg), last menstrual period 09/29/2019, SpO2 100 %. Physical Exam Vitals and nursing note reviewed.  Constitutional:      General: She is not in acute distress.    Appearance: Normal appearance.  Cardiovascular:     Rate and Rhythm: Normal rate and regular rhythm.     Heart sounds: Normal heart sounds. No  murmur heard.   Pulmonary:     Effort: Pulmonary effort is normal. No respiratory distress.     Breath sounds: Normal breath sounds. No wheezing.  Skin:    General: Skin is warm and dry.     Comments: Right femoral puncture site soft without active bleeding or hematoma. Distal pulses (DPs) 2+ bilaterally.  Neurological:     Mental Status: She is alert and oriented to person, place, and time.     Disposition: Discharge disposition: 01-Home or Self Care       Discharge Instructions    Call MD for:  difficulty breathing, headache or visual disturbances   Complete by: As directed    Call MD for:  extreme fatigue   Complete by: As directed    Call MD for:  hives   Complete by: As directed    Call MD for:  persistant dizziness or light-headedness   Complete by: As directed    Call MD for:  persistant nausea and vomiting   Complete by: As directed    Call MD for:  redness, tenderness, or signs of infection (pain, swelling, redness, odor or green/yellow discharge around incision site)   Complete by: As directed    Call MD for:  severe uncontrolled pain   Complete by: As directed    Call MD for:  temperature >100.4   Complete by: As directed    Diet - low sodium heart healthy   Complete by: As directed    Discharge instructions   Complete by: As directed    Ok to shower 48 hours post-procedure. Recommend showering with bandage on, remove bandage immediately after showering and pat area dry. No further dressing changes needed after this- ensure area remains clean and dry until fully healed. No submerging (swimming, bathing) for 7 days post-procedure. Plan to follow-up with Dr. Anselm Pancoast for televisit 1 month after discharge (our office will call you to set up this appointment).   Increase activity slowly   Complete by: As directed      Allergies as of 10/04/2019   No Known Allergies     Medication List    STOP taking these medications   doxycycline 100 MG tablet Commonly known  as: VIBRA-TABS     TAKE these medications  docusate sodium 100 MG capsule Commonly known as: COLACE Take 1 capsule (100 mg total) by mouth daily. For 7 days   ergocalciferol 1.25 MG (50000 UT) capsule Commonly known as: Drisdol Take 1 capsule (50,000 Units total) by mouth once a week.   ferrous sulfate 220 (44 Fe) MG/5ML solution Take 220 mg by mouth daily with breakfast.   ibuprofen 600 MG tablet Commonly known as: ADVIL Take one tablet by mouth once every 6 hours for 5 days, then once every 6 hours as needed for moderate-severe pain.   ondansetron 8 MG tablet Commonly known as: Zofran Take 1 tablet (8 mg total) by mouth every 8 (eight) hours as needed for nausea or vomiting.   oxyCODONE-acetaminophen 5-325 MG tablet Commonly known as: PERCOCET/ROXICET Take 1 tablet by mouth every 4 (four) hours as needed for moderate pain or severe pain.       Follow-up Information    Markus Daft, MD Follow up in 1 month(s).   Specialties: Interventional Radiology, Radiology Why: Please follow-up with Dr. Anselm Pancoast for televisit 1 month after discharge. Our office will call you to set up this appointment. Contact information: [address] [address] [phone]                Electronically Signed: Earley Abide, PA-C 10/04/2019, 2:18 PM   I have spent Greater Than 30 Minutes discharging [place].

## 2019-10-04 NOTE — Discharge Instructions (Addendum)
Uterine Artery Embolization for Fibroids, Care After This sheet gives you information about how to care for yourself after your procedure. Your health care provider may also give you more specific instructions. If you have problems or questions, contact your health care provider. What can I expect after the procedure? After your procedure, it is common to have:  Pelvic cramping. You will be given pain medicine.  Nausea and vomiting. You may be given medicine to help relieve nausea. Follow these instructions at home: Incision care  Follow instructions from your health care provider about how to take care of your incision. Make sure you: ? Wash your hands with soap and water before you change your bandage (dressing). If soap and water are not available, use hand sanitizer. ? No further dressing changes needed after first shower (see showering instructions regarding more information on this).  Shower/bathing instructions: ? Ok to shower 48 hours post-procedure. Recommend showering with bandage on, remove bandage immediately after showering and pat area dry. No further dressing changes needed after this- ensure area remains clean and dry until fully healed. ? No submerging (swimming, bathing) for 7 days post-procedure.  Check your incision area every day for signs of infection. Check for: ? More redness, swelling, or pain. ? More fluid or blood. ? Warmth. ? Pus or a bad smell. Medicines  Take over-the-counter and prescription medicines only as told by your health care provider.  Do not drive or use heavy machinery while taking prescription pain medicine (Percocet).  See discharge summary regarding medication regimen. General instructions  To prevent or treat constipation while you are taking prescription pain medicine, your health care provider may recommend that you: ? Drink enough fluid to keep your urine clear or pale yellow. ? Eat foods that are high in fiber, such as fresh fruits  and vegetables, whole grains, and beans. ? Limit foods that are high in fat and processed sugars, such as fried and sweet foods. Contact a health care provider if:  You have a fever.  You have more redness, swelling, or pain around your incision site.  You have more fluid or blood coming from your incision site.  Your incision feels warm to the touch.  You have pus or a bad smell coming from your incision.  You have a rash.  You have uncontrolled nausea or you cannot eat or drink anything without vomiting. Get help right away if:  You have trouble breathing.  You have chest pain.  You have severe abdominal pain.  You have leg pain.  You become dizzy and faint.  This information is not intended to replace advice given to you by your health care provider. Make sure you discuss any questions you have with your health care provider. Document Revised: 01/07/2017 Document Reviewed: 04/29/2016 Elsevier Patient Education  2020 [employer].

## 2019-10-05 ENCOUNTER — Inpatient Hospital Stay: Payer: 59 | Admitting: Oncology

## 2019-10-05 ENCOUNTER — Inpatient Hospital Stay: Payer: 59

## 2019-10-11 ENCOUNTER — Other Ambulatory Visit: Payer: 59 | Admitting: Internal Medicine

## 2019-10-12 ENCOUNTER — Other Ambulatory Visit: Payer: 59 | Admitting: Internal Medicine

## 2019-10-16 ENCOUNTER — Telehealth: Payer: Self-pay | Admitting: Internal Medicine

## 2019-10-16 NOTE — Telephone Encounter (Signed)
Pt called and said that she had lab work done at [facility] on 10/03/19 and didn't know if that would be good enough for her upcoming CPE on 10/22/19 or if she would need to still come in for labs before hand, Please advise

## 2019-10-16 NOTE — Telephone Encounter (Signed)
She should have come for fasting labs. The only thing we can uses is the CBC from [facility].

## 2019-10-18 ENCOUNTER — Other Ambulatory Visit: Payer: 59 | Admitting: Internal Medicine

## 2019-10-18 ENCOUNTER — Other Ambulatory Visit: Payer: Self-pay

## 2019-10-18 ENCOUNTER — Encounter: Payer: Self-pay | Admitting: Internal Medicine

## 2019-10-18 DIAGNOSIS — Z1329 Encounter for screening for other suspected endocrine disorder: Secondary | ICD-10-CM | POA: Diagnosis not present

## 2019-10-18 DIAGNOSIS — E559 Vitamin D deficiency, unspecified: Secondary | ICD-10-CM | POA: Diagnosis not present

## 2019-10-18 DIAGNOSIS — Z0000 Encounter for general adult medical examination without abnormal findings: Secondary | ICD-10-CM | POA: Diagnosis not present

## 2019-10-18 DIAGNOSIS — D509 Iron deficiency anemia, unspecified: Secondary | ICD-10-CM | POA: Diagnosis not present

## 2019-10-18 DIAGNOSIS — Z13220 Encounter for screening for lipoid disorders: Secondary | ICD-10-CM | POA: Diagnosis not present

## 2019-10-18 DIAGNOSIS — K909 Intestinal malabsorption, unspecified: Secondary | ICD-10-CM | POA: Diagnosis not present

## 2019-10-18 DIAGNOSIS — D259 Leiomyoma of uterus, unspecified: Secondary | ICD-10-CM | POA: Diagnosis not present

## 2019-10-18 NOTE — Addendum Note (Signed)
Addended by: Mady Haagensen on: 10/18/2019 09:30 AM   Modules accepted: Orders

## 2019-10-19 ENCOUNTER — Encounter: Payer: 59 | Admitting: Internal Medicine

## 2019-10-19 LAB — COMPLETE METABOLIC PANEL WITH GFR
AG Ratio: 1.4 (calc) (ref 1.0–2.5)
ALT: 8 U/L (ref 6–29)
AST: 10 U/L (ref 10–30)
Albumin: 3.9 g/dL (ref 3.6–5.1)
Alkaline phosphatase (APISO): 51 U/L (ref 31–125)
BUN: 12 mg/dL (ref 7–25)
CO2: 25 mmol/L (ref 20–32)
Calcium: 8.9 mg/dL (ref 8.6–10.2)
Chloride: 106 mmol/L (ref 98–110)
Creat: 0.69 mg/dL (ref 0.50–1.10)
GFR, Est African American: 123 mL/min/{1.73_m2} (ref >=60)
GFR, Est Non African American: 106 mL/min/{1.73_m2} (ref >=60)
Globulin: 2.7 g/dL (calc) (ref 1.9–3.7)
Glucose, Bld: 80 mg/dL (ref 65–99)
Potassium: 4.9 mmol/L (ref 3.5–5.3)
Sodium: 138 mmol/L (ref 135–146)
Total Bilirubin: 0.5 mg/dL (ref 0.2–1.2)
Total Protein: 6.6 g/dL (ref 6.1–8.1)

## 2019-10-19 LAB — LIPID PANEL
Cholesterol: 150 mg/dL (ref <200)
HDL: 37 mg/dL — ABNORMAL LOW (ref >=50)
LDL Cholesterol (Calc): 96 mg/dL (calc)
Non-HDL Cholesterol (Calc): 113 mg/dL (calc) (ref <130)
Total CHOL/HDL Ratio: 4.1 (calc) (ref <5.0)
Triglycerides: 84 mg/dL (ref <150)

## 2019-10-19 LAB — CBC WITH DIFFERENTIAL/PLATELET
Absolute Monocytes: 474 cells/uL (ref 200–950)
Basophils Absolute: 81 cells/uL (ref 0–200)
Basophils Relative: 1.1 %
Eosinophils Absolute: 111 cells/uL (ref 15–500)
Eosinophils Relative: 1.5 %
HCT: 25.3 % — ABNORMAL LOW (ref 35.0–45.0)
Hemoglobin: 6.7 g/dL — ABNORMAL LOW (ref 11.7–15.5)
Lymphs Abs: 2013 cells/uL (ref 850–3900)
MCH: 17.6 pg — ABNORMAL LOW (ref 27.0–33.0)
MCHC: 26.5 g/dL — ABNORMAL LOW (ref 32.0–36.0)
MCV: 66.6 fL — ABNORMAL LOW (ref 80.0–100.0)
MPV: 10.1 fL (ref 7.5–12.5)
Monocytes Relative: 6.4 %
Neutro Abs: 4721 cells/uL (ref 1500–7800)
Neutrophils Relative %: 63.8 %
Platelets: 600 10*3/uL — ABNORMAL HIGH (ref 140–400)
RBC: 3.8 10*6/uL (ref 3.80–5.10)
RDW: 18 % — ABNORMAL HIGH (ref 11.0–15.0)
Total Lymphocyte: 27.2 %
WBC: 7.4 10*3/uL (ref 3.8–10.8)

## 2019-10-19 LAB — VITAMIN D 25 HYDROXY (VIT D DEFICIENCY, FRACTURES): Vit D, 25-Hydroxy: 32 ng/mL (ref 30–100)

## 2019-10-19 LAB — TSH: TSH: 2.1 mIU/L

## 2019-10-22 ENCOUNTER — Ambulatory Visit (INDEPENDENT_AMBULATORY_CARE_PROVIDER_SITE_OTHER): Payer: 59 | Admitting: Internal Medicine

## 2019-10-22 ENCOUNTER — Encounter: Payer: Self-pay | Admitting: Internal Medicine

## 2019-10-22 ENCOUNTER — Other Ambulatory Visit: Payer: Self-pay

## 2019-10-22 ENCOUNTER — Encounter: Payer: Self-pay | Admitting: *Deleted

## 2019-10-22 VITALS — BP 100/64 | HR 98 | Ht 66.0 in | Wt 254.0 lb

## 2019-10-22 DIAGNOSIS — Z8742 Personal history of other diseases of the female genital tract: Secondary | ICD-10-CM | POA: Diagnosis not present

## 2019-10-22 DIAGNOSIS — Z0000 Encounter for general adult medical examination without abnormal findings: Secondary | ICD-10-CM | POA: Diagnosis not present

## 2019-10-22 DIAGNOSIS — R319 Hematuria, unspecified: Secondary | ICD-10-CM | POA: Diagnosis not present

## 2019-10-22 DIAGNOSIS — Z8639 Personal history of other endocrine, nutritional and metabolic disease: Secondary | ICD-10-CM

## 2019-10-22 DIAGNOSIS — D500 Iron deficiency anemia secondary to blood loss (chronic): Secondary | ICD-10-CM

## 2019-10-22 DIAGNOSIS — R8290 Unspecified abnormal findings in urine: Secondary | ICD-10-CM | POA: Diagnosis not present

## 2019-10-22 DIAGNOSIS — Z9889 Other specified postprocedural states: Secondary | ICD-10-CM | POA: Diagnosis not present

## 2019-10-22 LAB — POCT URINALYSIS DIPSTICK
Appearance: NEGATIVE
Bilirubin, UA: NEGATIVE
Glucose, UA: NEGATIVE
Ketones, UA: NEGATIVE
Leukocytes, UA: NEGATIVE
Nitrite, UA: NEGATIVE
Odor: NEGATIVE
Protein, UA: NEGATIVE
Spec Grav, UA: 1.01 (ref 1.010–1.025)
Urobilinogen, UA: 0.2 E.U./dL
pH, UA: 6.5 (ref 5.0–8.0)

## 2019-10-22 NOTE — Progress Notes (Signed)
Subjective:    Patient ID: Lori Lara, female    DOB: 10/07/1975, 44 y.o.   MRN: [id number]  HPI 44 year old Female for health maintenance exam and evaluation of medical issues. Had uterine artery embolization in  August by Dr. Markus Daft, radiologist and will follow up with Dr. Garwin Brothers. Very anemic and iron deficient. Needs follow up with Dr. Alen Blew.  She has been off oral iron about 2 months due to constipation issues with oral iron therapy.  Last seen by Dr. Alen Blew in April.  She was initially seen here for the first time in July 2018 with history of iron deficiency anemia.  Previously saw Dr. Beryle Beams in 2018 for iron deficiency anemia not responding to oral iron and received IV iron injections.  She was here in September 2020 at which time hemoglobin was 10.4 g with MCV of 79.1.  Total iron was 17 and ferritin was 5.  History of vitamin D deficiency discovered September 2020 and treated with high-dose vitamin D.  Records at [facility] in 2002 indicated she had a normal hemoglobin of 12.8 g.  In 2005 she had a hemoglobin of 11.5 g with an MCV of 84.3  She has a history of constipation and internal hemorrhoids treated with MiraLAX and Anusol suppositories in 2014.  At that time she was on oral iron supplement.  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  She has a high school education and is single.  She does not smoke or consume alcohol.  She has 2 sons, ages 25 and 14.  Family history: Father in good health.  Mother with history of hypertension and rheumatoid arthritis.  1 sister in her mid 77s in good health.  Maternal grandfather with history of pneumonia.  Paternal grandmother with history of dementia.  Review of recent labs shows her vitamin D to be low normal at 32, TSH is normal.  Lipid panel is normal  except for low HDL of 37.  C-Met is normal.  However I am concerned about her hemoglobin which is 6.7 g.  Her MCV is 66.6.  Her platelet count is 600,000 likely reacting to her iron deficiency.  White blood cell count is 7400.      Review of Systems she has prior history of menorrhagia and is status post recent uterine artery embolization     Objective:   Physical Exam Vital signs reviewed.  Skin warm and dry.  Nodes none.  TMs clear.  Neck supple.  No thyromegaly.  Chest clear to auscultation.  Cardiac exam regular rate and rhythm normal S1 and S2.  Breast without masses.  Abdomen soft nondistended without hepatosplenomegaly masses or tenderness.  No lower extremity pitting edema.       Assessment & Plan:  Severe microcytic anemia with history of iron deficiency anemia.  Hemoglobin is 6.7 g  History of menorrhagia causing iron deficiency-does not tolerate oral iron supplement.  Status post recent uterine artery embolization to correct menorrhagia  History of vitamin D deficiency-take 5000 units vitamin D3 daily over-the-counter.  Vitamin D level is low normal at 32.  Plan: Patient needs to return to hematology as soon as possible to be considered for IV iron infusion since she does not tolerate oral iron therapy.  She has severe microcytic anemia with hemoglobin 6.7 g.

## 2019-10-23 LAB — URINE CULTURE
MICRO NUMBER:: 10941967
SPECIMEN QUALITY:: ADEQUATE

## 2019-10-23 LAB — URINALYSIS, MICROSCOPIC ONLY

## 2019-11-03 NOTE — Patient Instructions (Signed)
We will asked Dr. Alen Blew to see her and consider IV iron infusion for severe anemia at this point in time.  Hopefully uterine artery embolization will resolve her menorrhagia.  She does not tolerate oral iron due to constipation.

## 2019-11-06 ENCOUNTER — Other Ambulatory Visit: Payer: Self-pay | Admitting: Oncology

## 2019-11-06 ENCOUNTER — Encounter: Payer: Self-pay | Admitting: *Deleted

## 2019-11-06 ENCOUNTER — Ambulatory Visit
Admission: RE | Admit: 2019-11-06 | Discharge: 2019-11-06 | Disposition: A | Discharge status: Home / Self Care | Payer: 59 | Source: Ambulatory Visit | Attending: Student | Admitting: Student

## 2019-11-06 ENCOUNTER — Other Ambulatory Visit: Payer: Self-pay

## 2019-11-06 DIAGNOSIS — Z920 Personal history of contraception: Secondary | ICD-10-CM | POA: Diagnosis not present

## 2019-11-06 DIAGNOSIS — D649 Anemia, unspecified: Secondary | ICD-10-CM | POA: Diagnosis not present

## 2019-11-06 DIAGNOSIS — D259 Leiomyoma of uterus, unspecified: Secondary | ICD-10-CM

## 2019-11-06 DIAGNOSIS — Z9889 Other specified postprocedural states: Secondary | ICD-10-CM | POA: Diagnosis not present

## 2019-11-06 HISTORY — PX: IR RADIOLOGIST EVAL & MGMT: IMG5224

## 2019-11-06 NOTE — Progress Notes (Signed)
Chief Complaint: Patient was consulted remotely today (TeleHealth) for follow-up of uterine artery embolization.  Referring Physician(s): Garwin Brothers, [name]  History of Present Illness: Lori Lara is a 44 y.o. female with uterine fibroids, menorrhagia and anemia.  She underwent uterine artery embolization procedure on 10/03/2019.  The immediate post embolization course was unremarkable in the hospital.  Patient had a small amount of vaginal discharge for 1 week after the procedure.  The vaginal discharge has stopped.  Patient had mild pain after the procedure.  She was taking 1-2 Percocet tablets along with the ibuprofen after the procedure.  She stopped taking pain medication approximately 2 weeks after the procedure.  Patient had a menstrual cycle approximately 1 week after the procedure.  She notes that the bleeding was much lighter than it had been in the past and the duration was approximately 5 days.  She continues to have intermittent cramping which she had prior to the procedure.  Patient started back at work on 10/24/2019.  She needs to occasionally get out of her chair or walk around due to some discomfort.  She has no issues at the right groin site but feels like she needs to stretch her right leg occasionally.  She denies fevers or chills.  She is scheduled to follow-up Dr. Alen Blew on October 6 for her anemia.  She is currently taking oral iron.  Past Medical History:  Diagnosis Date  . Anemia   . Vitamin D deficiency     Past Surgical History:  Procedure Laterality Date  . IR ANGIOGRAM PELVIS SELECTIVE OR SUPRASELECTIVE  10/03/2019  . IR ANGIOGRAM SELECTIVE EACH ADDITIONAL VESSEL  10/03/2019  . IR ANGIOGRAM SELECTIVE EACH ADDITIONAL VESSEL  10/03/2019  . IR EMBO TUMOR ORGAN ISCHEMIA INFARCT INC GUIDE ROADMAPPING  10/03/2019  . IR RADIOLOGIST EVAL & MGMT  07/11/2019  . IR RADIOLOGIST EVAL & MGMT  08/21/2019  . IR US GUIDE VASC ACCESS RIGHT  10/03/2019     Allergies: Patient has no known allergies.  Medications: Prior to Admission medications   Medication Sig Start Date End Date Taking? Authorizing Provider  docusate sodium (COLACE) 100 MG capsule Take 1 capsule (100 mg total) by mouth daily. For 7 days 10/04/19   Ronney Lion, PA-C  ergocalciferol (DRISDOL) 1.25 MG (50000 UT) capsule Take 1 capsule (50,000 Units total) by mouth once a week. 10/18/18   Elby Showers, MD  ferrous sulfate 220 (44 Fe) MG/5ML solution Take 220 mg by mouth daily with breakfast.    [provider]     No family history on file.  Social History   Socioeconomic History  . Marital status: Single    Spouse name: Not on file  . Number of children: Not on file  . Years of education: Not on file  . Highest education level: Not on file  Occupational History  . Not on file  Tobacco Use  . Smoking status: Never Smoker  . Smokeless tobacco: Never Used  Substance and Sexual Activity  . Alcohol use: No  . Drug use: No  . Sexual activity: Not on file  Other Topics Concern  . Not on file  Social History Narrative  . Not on file   Social Determinants of Health   Financial Resource Strain:   . Difficulty of Paying Living Expenses: Not on file  Food Insecurity:   . Worried About Charity fundraiser in the Last Year: Not on file  . Ran Out of Food in  the Last Year: Not on file  Transportation Needs:   . Lack of Transportation (Medical): Not on file  . Lack of Transportation (Non-Medical): Not on file  Physical Activity:   . Days of Exercise per Week: Not on file  . Minutes of Exercise per Session: Not on file  Stress:   . Feeling of Stress : Not on file  Social Connections:   . Frequency of Communication with Friends and Family: Not on file  . Frequency of Social Gatherings with Friends and Family: Not on file  . Attends Religious Services: Not on file  . Active Member of Clubs or Organizations: Not on file  . Attends Theatre manager Meetings: Not on file  . Marital Status: Not on file     Review of Systems  Constitutional: Negative for chills and fever.  Genitourinary: Positive for pelvic pain and vaginal bleeding.     Physical Exam No direct physical exam was performed   Vital Signs: There were no vitals taken for this visit.  Imaging: No results found.  Labs:  CBC: Recent Labs    02/23/19 0828 06/01/19 1447 10/03/19 1235 10/18/19 0943  WBC 7.5 6.8 5.5 7.4  HGB 8.1* 9.5* 6.8* 6.7*  HCT 29.1* 31.6* 25.4* 25.3*  PLT 430* 387 293 600*    COAGS: Recent Labs    10/03/19 1235  INR 1.0    BMP: Recent Labs    02/23/19 0828 10/03/19 1235 10/18/19 0943  NA 137 136 138  K 3.7 3.8 4.9  CL 107 108 106  CO2 24 22 25   GLUCOSE 101* 91 80  BUN 10 10 12   CALCIUM 8.2* 8.7* 8.9  CREATININE 0.69 0.64 0.69  GFRNONAA >60 >60 106  GFRAA >60 >60 123    LIVER FUNCTION TESTS: Recent Labs    02/23/19 0828 10/18/19 0943  BILITOT 0.5 0.5  AST 14* 10  ALT 11 8  ALKPHOS 50  --   PROT 6.6 6.6  ALBUMIN 3.7  --     TUMOR MARKERS: No results for input(s): AFPTM, CEA, CA199, CHROMGRNA in the last 8760 hours.  Assessment and Plan:  44 year old with uterine fibroids, menorrhagia and anemia.  Patient underwent a successful uterine artery embolization procedure on 10/03/2019.  Her first menstrual bleeding since the embolization was much lighter than it has been in the past.  She had a small amount of vaginal discharge which has resolved.  She continues to have intermittent pelvic cramping but she had similar symptoms prior to the procedure.  Overall, the patient is doing very well following the uterine artery embolization procedure.  She will follow up with hematology for her anemia.  Hopefully the anemia will be easier to control now that the menstrual bleeding has decreased.  Plan for a follow-up visit and MRI in 6 months.  Patient will contact us if she has any questions or concerns in the  interim.  Thank you for this interesting consult.  I greatly enjoyed meeting Lori Lara and look forward to participating in their care.  A copy of this report was sent to the requesting provider on this date.  Electronically Signed: Burman Riis 11/06/2019, 9:07 AM   I spent a total of    10 Minutes in remote  clinical consultation, greater than 50% of which was counseling/coordinating care for uterine fibroids and uterine artery embolization procedure.    Visit type: Audio only (telephone). Audio (no video) only due to Patient preference. Alternative for in-person consultation  at [facility], [address] [place], [place], [place]. This visit type was conducted due to national recommendations for restrictions regarding the COVID-19 Pandemic (e.g. social distancing).  This format is felt to be most appropriate for this patient at this time.  All issues noted in this document were discussed and addressed.  Patient ID: Lori Lara, female   DOB: [birth year], 44 y.o.   MRN: [id number]

## 2019-11-13 ENCOUNTER — Other Ambulatory Visit: Payer: Self-pay | Admitting: Oncology

## 2019-11-13 DIAGNOSIS — D509 Iron deficiency anemia, unspecified: Secondary | ICD-10-CM

## 2019-11-14 ENCOUNTER — Inpatient Hospital Stay: Payer: 59 | Attending: Oncology

## 2019-11-14 ENCOUNTER — Other Ambulatory Visit: Payer: Self-pay

## 2019-11-14 ENCOUNTER — Inpatient Hospital Stay: Payer: 59

## 2019-11-14 ENCOUNTER — Inpatient Hospital Stay (HOSPITAL_BASED_OUTPATIENT_CLINIC_OR_DEPARTMENT_OTHER): Payer: 59 | Admitting: Oncology

## 2019-11-14 VITALS — BP 113/70 | HR 80 | Temp 97.7°F | Resp 18 | Wt 258.8 lb

## 2019-11-14 VITALS — BP 105/68 | HR 59 | Resp 16

## 2019-11-14 DIAGNOSIS — D500 Iron deficiency anemia secondary to blood loss (chronic): Secondary | ICD-10-CM | POA: Diagnosis not present

## 2019-11-14 DIAGNOSIS — D509 Iron deficiency anemia, unspecified: Secondary | ICD-10-CM | POA: Diagnosis not present

## 2019-11-14 DIAGNOSIS — N920 Excessive and frequent menstruation with regular cycle: Secondary | ICD-10-CM | POA: Insufficient documentation

## 2019-11-14 LAB — CBC WITH DIFFERENTIAL (CANCER CENTER ONLY)
Abs Immature Granulocytes: 0.02 10*3/uL (ref 0.00–0.07)
Basophils Absolute: 0.1 10*3/uL (ref 0.0–0.1)
Basophils Relative: 1 %
Eosinophils Absolute: 0.1 10*3/uL (ref 0.0–0.5)
Eosinophils Relative: 2 %
HCT: 26.5 % — ABNORMAL LOW (ref 36.0–46.0)
Hemoglobin: 7 g/dL — ABNORMAL LOW (ref 12.0–15.0)
Immature Granulocytes: 0 %
Lymphocytes Relative: 27 %
Lymphs Abs: 1.5 10*3/uL (ref 0.7–4.0)
MCH: 17.9 pg — ABNORMAL LOW (ref 26.0–34.0)
MCHC: 26.4 g/dL — ABNORMAL LOW (ref 30.0–36.0)
MCV: 67.6 fL — ABNORMAL LOW (ref 80.0–100.0)
Monocytes Absolute: 0.4 10*3/uL (ref 0.1–1.0)
Monocytes Relative: 7 %
Neutro Abs: 3.5 10*3/uL (ref 1.7–7.7)
Neutrophils Relative %: 63 %
Platelet Count: 338 10*3/uL (ref 150–400)
RBC: 3.92 MIL/uL (ref 3.87–5.11)
RDW: 19.3 % — ABNORMAL HIGH (ref 11.5–15.5)
WBC Count: 5.6 10*3/uL (ref 4.0–10.5)
nRBC: 0 % (ref 0.0–0.2)

## 2019-11-14 LAB — FERRITIN: Ferritin: <4 ng/mL — ABNORMAL LOW (ref 11–307)

## 2019-11-14 LAB — IRON AND TIBC
Iron: 11 ug/dL — ABNORMAL LOW (ref 41–142)
Saturation Ratios: 3 % — ABNORMAL LOW (ref 21–57)
TIBC: 384 ug/dL (ref 236–444)
UIBC: 373 ug/dL (ref 120–384)

## 2019-11-14 MED ORDER — SODIUM CHLORIDE 0.9 % IV SOLN
125.0000 mg | Freq: Once | INTRAVENOUS | Status: AC
  Administered 2019-11-14: 125 mg via INTRAVENOUS
  Filled 2019-11-14: qty 10

## 2019-11-14 MED ORDER — SODIUM CHLORIDE 0.9 % IV SOLN
Freq: Once | INTRAVENOUS | Status: AC
  Filled 2019-11-14: qty 250

## 2019-11-14 NOTE — Progress Notes (Signed)
Hematology and Oncology Follow Up   Lori Lara [id number] Mar 19, 1975 44 y.o. 11/14/2019 8:12 AM Baxley, [name], MDBaxley, [name], MD       Principle Diagnosis:  44 year old woman with anemia presented with iron deficiency related to chronic menstrual losses and uterine fibroids in July 2018.   Prior Therapy:   She is status post Feraheme infusion in December 2018.  She is status post Venofer infusion completed on April 20, 2019 for a total of 1000 mg.   Current therapy:  Oral iron replacement and repeat intravenous iron as needed.   Interim History: Lori Lara is here for repeat evaluation.  Since the last visit, she underwent uterine artery embolization completed by Dr. Anselm Pancoast on October 04, 2019.  Since her procedure, she reports decrease in her menstrual cycle volume although she does have occasional bleeding.  She has been taking oral iron replacement which have helped her symptoms at this time.  She did report symptoms of dizziness and lightheadedness which is improving slowly.  She does report some fatigue and tiredness associated with her anemia.  She denies any hematochezia or melena.     Medications: Updated on review. Current Outpatient Medications  Medication Sig Dispense Refill  . docusate sodium (COLACE) 100 MG capsule Take 1 capsule (100 mg total) by mouth daily. For 7 days 7 capsule 0  . ergocalciferol (DRISDOL) 1.25 MG (50000 UT) capsule Take 1 capsule (50,000 Units total) by mouth once a week. 12 capsule 3  . ferrous sulfate 220 (44 Fe) MG/5ML solution Take 220 mg by mouth daily with breakfast.     No current facility-administered medications for this visit.     Allergies: No Known Allergies  Physical exam: Blood pressure 113/70, pulse 80, temperature 97.7 F (36.5 C), temperature source Tympanic, resp. rate 18, weight 258 lb 12.8 oz (117.4 kg), SpO2 99 %.    ECOG 0    General appearance: Alert, awake without any distress. Head:  Atraumatic without abnormalities Oropharynx: Without any thrush or ulcers. Eyes: No scleral icterus. Lymph nodes: No lymphadenopathy noted in the cervical, supraclavicular, or axillary nodes Heart:regular rate and rhythm, without any murmurs or gallops.   Lung: Clear to auscultation without any rhonchi, wheezes or dullness to percussion. Abdomin: Soft, nontender without any shifting dullness or ascites. Musculoskeletal: No clubbing or cyanosis. Neurological: No motor or sensory deficits. Skin: No rashes or lesions. Psychiatric: Mood and affect appeared normal.     Lab Results: Lab Results  Component Value Date   WBC 7.4 10/18/2019   HGB 6.7 (L) 10/18/2019   HCT 25.3 (L) 10/18/2019   MCV 66.6 (L) 10/18/2019   PLT 600 (H) 10/18/2019     Chemistry      Component Value Date/Time   NA 138 10/18/2019 0943   K 4.9 10/18/2019 0943   CL 106 10/18/2019 0943   CO2 25 10/18/2019 0943   BUN 12 10/18/2019 0943   CREATININE 0.69 10/18/2019 0943      Component Value Date/Time   CALCIUM 8.9 10/18/2019 0943   ALKPHOS 50 02/23/2019 0828   AST 10 10/18/2019 0943   AST 14 (L) 02/23/2019 0828   ALT 8 10/18/2019 0943   ALT 11 02/23/2019 0828   BILITOT 0.5 10/18/2019 0943   BILITOT 0.5 02/23/2019 0828       Results for Lori Lara, Lori Lara (MRN [id number]) as of 11/14/2019 08:15  Ref. Range 02/23/2019 08:27 06/01/2019 14:48  Iron Latest Ref Range: 41 - 142 ug/dL 13 (L) 19 (  L)  UIBC Latest Ref Range: 120 - 384 ug/dL 379 361  TIBC Latest Ref Range: 236 - 444 ug/dL 391 380  Saturation Ratios Latest Ref Range: 21 - 57 % 3 (L) 5 (L)  Ferritin Latest Ref Range: 11 - 307 ng/mL <4 (L) <4 (L)     Impression and Plan:  44 year old woman with:  1.  Anemia related to iron deficiency and chronic menstrual blood losses dating back to 2018.  She is status post intravenous iron in few occasions was the most recent of which in March 2021.  Her hemoglobin continues to be low despite aggressive  replacement with IV iron.  Risks and benefits of repeat intravenous iron infusion were discussed.  Given the lack of effectiveness associated with Venofer, we will try Ferrlecit as an option.  If Ferrlecit is not effective at this time, we will return to the use of Feraheme which has not been preferred by her insurance.  Complications associated with intravenous iron infusion were discussed.  These complications include arthralgias, myalgias and infusion related complications were discussed.  She is agreeable to receive Ferrlecit which will be given weekly for 4 weeks.  We will continue to follow closely and replace IV iron aggressively till her iron stores are fully repleted.   2.  Menorrhagia: Related to uterine fibroids.  She is status post uterine artery embolization completed on October 03, 2019 by Dr. Anselm Pancoast interventional radiology.  Her menstrual bleeding has decreased at this time.   3. follow-up: In 3 months for repeat evaluation.   30  minutes were spent on this encounter.  The time was dedicated to reviewing her laboratory data, disease status update, discussing treatment options and complications with therapy.   Zola Button, MD 11/14/2019 8:12 AM

## 2019-11-14 NOTE — Patient Instructions (Signed)
Sodium Ferric Gluconate Complex injection What is this medicine? SODIUM FERRIC GLUCONATE COMPLEX (SOE dee um FER ik GLOO koe nate KOM pleks) is an iron replacement. It is used with epoetin therapy to treat low iron levels in patients who are receiving hemodialysis. This medicine may be used for other purposes; ask your health care provider or pharmacist if you have questions. COMMON BRAND NAME(S): Ferrlecit, Nulecit What should I tell my health care provider before I take this medicine? They need to know if you have any of the following conditions:  anemia that is not from iron deficiency  high levels of iron in the body  an unusual or allergic reaction to iron, benzyl alcohol, other medicines, foods, dyes, or preservatives  pregnant or are trying to become pregnant  breast-feeding How should I use this medicine? This medicine is for infusion into a vein. It is given by a health care professional in a hospital or clinic setting. Talk to your pediatrician regarding the use of this medicine in children. While this drug may be prescribed for children as young as 6 years old for selected conditions, precautions do apply. Overdosage: If you think you have taken too much of this medicine contact a poison control center or emergency room at once. NOTE: This medicine is only for you. Do not share this medicine with others. What if I miss a dose? It is important not to miss your dose. Call your doctor or health care professional if you are unable to keep an appointment. What may interact with this medicine? Do not take this medicine with any of the following medications:  deferoxamine  dimercaprol  other iron products This medicine may also interact with the following medications:  chloramphenicol  deferasirox  medicine for blood pressure like enalapril This list may not describe all possible interactions. Give your health care provider a list of all the medicines, herbs,  non-prescription drugs, or dietary supplements you use. Also tell them if you smoke, drink alcohol, or use illegal drugs. Some items may interact with your medicine. What should I watch for while using this medicine? Your condition will be monitored carefully while you are receiving this medicine. Visit your doctor for check-ups as directed. What side effects may I notice from receiving this medicine? Side effects that you should report to your doctor or health care professional as soon as possible:  allergic reactions like skin rash, itching or hives, swelling of the face, lips, or tongue  breathing problems  changes in hearing  changes in vision  chills, flushing, or sweating  fast, irregular heartbeat  feeling faint or lightheaded, falls  fever, flu-like symptoms  high or low blood pressure  pain, tingling, numbness in the hands or feet  severe pain in the chest, back, flanks, or groin  swelling of the ankles, feet, hands  trouble passing urine or change in the amount of urine  unusually weak or tired Side effects that usually do not require medical attention (report to your doctor or health care professional if they continue or are bothersome):  cramps  dark colored stools  diarrhea  headache  nausea, vomiting  stomach upset This list may not describe all possible side effects. Call your doctor for medical advice about side effects. You may report side effects to FDA at 1-800-FDA-1088. Where should I keep my medicine? This drug is given in a hospital or clinic and will not be stored at home. NOTE: This sheet is a summary. It may not cover all   possible information. If you have questions about this medicine, talk to your doctor, pharmacist, or health care provider.  2020 Elsevier/Gold Standard (2007-09-27 15:58:57)  

## 2019-11-21 ENCOUNTER — Other Ambulatory Visit: Payer: Self-pay

## 2019-11-21 ENCOUNTER — Inpatient Hospital Stay: Payer: 59

## 2019-11-21 VITALS — BP 112/79 | HR 65 | Temp 98.7°F | Resp 16

## 2019-11-21 DIAGNOSIS — N920 Excessive and frequent menstruation with regular cycle: Secondary | ICD-10-CM | POA: Diagnosis not present

## 2019-11-21 DIAGNOSIS — D500 Iron deficiency anemia secondary to blood loss (chronic): Secondary | ICD-10-CM | POA: Diagnosis not present

## 2019-11-21 DIAGNOSIS — D509 Iron deficiency anemia, unspecified: Secondary | ICD-10-CM

## 2019-11-21 MED ORDER — SODIUM CHLORIDE 0.9 % IV SOLN
125.0000 mg | Freq: Once | INTRAVENOUS | Status: AC
  Administered 2019-11-21: 125 mg via INTRAVENOUS
  Filled 2019-11-21: qty 10

## 2019-11-21 MED ORDER — SODIUM CHLORIDE 0.9 % IV SOLN
Freq: Once | INTRAVENOUS | Status: AC
  Filled 2019-11-21: qty 250

## 2019-11-21 NOTE — Patient Instructions (Signed)
Sodium Ferric Gluconate Complex injection What is this medicine? SODIUM FERRIC GLUCONATE COMPLEX (SOE dee um FER ik GLOO koe nate KOM pleks) is an iron replacement. It is used with epoetin therapy to treat low iron levels in patients who are receiving hemodialysis. This medicine may be used for other purposes; ask your health care provider or pharmacist if you have questions. COMMON BRAND NAME(S): Ferrlecit, Nulecit What should I tell my health care provider before I take this medicine? They need to know if you have any of the following conditions:  anemia that is not from iron deficiency  high levels of iron in the body  an unusual or allergic reaction to iron, benzyl alcohol, other medicines, foods, dyes, or preservatives  pregnant or are trying to become pregnant  breast-feeding How should I use this medicine? This medicine is for infusion into a vein. It is given by a health care professional in a hospital or clinic setting. Talk to your pediatrician regarding the use of this medicine in children. While this drug may be prescribed for children as [place] as 6 years old for selected conditions, precautions do apply. Overdosage: If you think you have taken too much of this medicine contact a poison control center or emergency room at once. NOTE: This medicine is only for you. Do not share this medicine with others. What if I miss a dose? It is important not to miss your dose. Call your doctor or health care professional if you are unable to keep an appointment. What may interact with this medicine? Do not take this medicine with any of the following medications:  deferoxamine  dimercaprol  other iron products This medicine may also interact with the following medications:  chloramphenicol  deferasirox  medicine for blood pressure like enalapril This list may not describe all possible interactions. Give your health care provider a list of all the medicines, herbs,  non-prescription drugs, or dietary supplements you use. Also tell them if you smoke, drink alcohol, or use illegal drugs. Some items may interact with your medicine. What should I watch for while using this medicine? Your condition will be monitored carefully while you are receiving this medicine. Visit your doctor for check-ups as directed. What side effects may I notice from receiving this medicine? Side effects that you should report to your doctor or health care professional as soon as possible:  allergic reactions like skin rash, itching or hives, swelling of the face, lips, or tongue  breathing problems  changes in hearing  changes in vision  chills, flushing, or sweating  fast, irregular heartbeat  feeling faint or lightheaded, falls  fever, flu-like symptoms  high or low blood pressure  pain, tingling, numbness in the hands or feet  severe pain in the chest, back, flanks, or groin  swelling of the ankles, feet, hands  trouble passing urine or change in the amount of urine  unusually weak or tired Side effects that usually do not require medical attention (report to your doctor or health care professional if they continue or are bothersome):  cramps  dark colored stools  diarrhea  headache  nausea, vomiting  stomach upset This list may not describe all possible side effects. Call your doctor for medical advice about side effects. You may report side effects to FDA at 1-800-FDA-1088. Where should I keep my medicine? This drug is given in a hospital or clinic and will not be stored at home. NOTE: This sheet is a summary. It may not cover all   possible information. If you have questions about this medicine, talk to your doctor, pharmacist, or health care provider.  2020 Elsevier/Gold Standard (2007-09-27 15:58:57)  

## 2019-11-21 NOTE — Progress Notes (Signed)
Patient declined 30-minute post-observation after iron.

## 2019-11-28 ENCOUNTER — Other Ambulatory Visit: Payer: Self-pay

## 2019-11-28 ENCOUNTER — Inpatient Hospital Stay: Payer: 59

## 2019-11-28 VITALS — BP 115/68 | HR 68 | Temp 98.6°F | Resp 18

## 2019-11-28 DIAGNOSIS — D509 Iron deficiency anemia, unspecified: Secondary | ICD-10-CM

## 2019-11-28 DIAGNOSIS — N920 Excessive and frequent menstruation with regular cycle: Secondary | ICD-10-CM | POA: Diagnosis not present

## 2019-11-28 DIAGNOSIS — D500 Iron deficiency anemia secondary to blood loss (chronic): Secondary | ICD-10-CM | POA: Diagnosis not present

## 2019-11-28 MED ORDER — SODIUM CHLORIDE 0.9 % IV SOLN
125.0000 mg | Freq: Once | INTRAVENOUS | Status: AC
  Administered 2019-11-28: 125 mg via INTRAVENOUS
  Filled 2019-11-28: qty 10

## 2019-11-28 MED ORDER — SODIUM CHLORIDE 0.9 % IV SOLN
Freq: Once | INTRAVENOUS | Status: AC
  Filled 2019-11-28: qty 250

## 2019-11-28 NOTE — Patient Instructions (Signed)
Sodium Ferric Gluconate Complex injection What is this medicine? SODIUM FERRIC GLUCONATE COMPLEX (SOE dee um FER ik GLOO koe nate KOM pleks) is an iron replacement. It is used with epoetin therapy to treat low iron levels in patients who are receiving hemodialysis. This medicine may be used for other purposes; ask your health care provider or pharmacist if you have questions. COMMON BRAND NAME(S): Ferrlecit, Nulecit What should I tell my health care provider before I take this medicine? They need to know if you have any of the following conditions:  anemia that is not from iron deficiency  high levels of iron in the body  an unusual or allergic reaction to iron, benzyl alcohol, other medicines, foods, dyes, or preservatives  pregnant or are trying to become pregnant  breast-feeding How should I use this medicine? This medicine is for infusion into a vein. It is given by a health care professional in a hospital or clinic setting. Talk to your pediatrician regarding the use of this medicine in children. While this drug may be prescribed for children as young as 6 years old for selected conditions, precautions do apply. Overdosage: If you think you have taken too much of this medicine contact a poison control center or emergency room at once. NOTE: This medicine is only for you. Do not share this medicine with others. What if I miss a dose? It is important not to miss your dose. Call your doctor or health care professional if you are unable to keep an appointment. What may interact with this medicine? Do not take this medicine with any of the following medications:  deferoxamine  dimercaprol  other iron products This medicine may also interact with the following medications:  chloramphenicol  deferasirox  medicine for blood pressure like enalapril This list may not describe all possible interactions. Give your health care provider a list of all the medicines, herbs,  non-prescription drugs, or dietary supplements you use. Also tell them if you smoke, drink alcohol, or use illegal drugs. Some items may interact with your medicine. What should I watch for while using this medicine? Your condition will be monitored carefully while you are receiving this medicine. Visit your doctor for check-ups as directed. What side effects may I notice from receiving this medicine? Side effects that you should report to your doctor or health care professional as soon as possible:  allergic reactions like skin rash, itching or hives, swelling of the face, lips, or tongue  breathing problems  changes in hearing  changes in vision  chills, flushing, or sweating  fast, irregular heartbeat  feeling faint or lightheaded, falls  fever, flu-like symptoms  high or low blood pressure  pain, tingling, numbness in the hands or feet  severe pain in the chest, back, flanks, or groin  swelling of the ankles, feet, hands  trouble passing urine or change in the amount of urine  unusually weak or tired Side effects that usually do not require medical attention (report to your doctor or health care professional if they continue or are bothersome):  cramps  dark colored stools  diarrhea  headache  nausea, vomiting  stomach upset This list may not describe all possible side effects. Call your doctor for medical advice about side effects. You may report side effects to FDA at 1-800-FDA-1088. Where should I keep my medicine? This drug is given in a hospital or clinic and will not be stored at home. NOTE: This sheet is a summary. It may not cover all   possible information. If you have questions about this medicine, talk to your doctor, pharmacist, or health care provider.  2020 Elsevier/Gold Standard (2007-09-27 15:58:57)  

## 2019-12-05 ENCOUNTER — Inpatient Hospital Stay: Payer: 59

## 2019-12-05 ENCOUNTER — Other Ambulatory Visit: Payer: Self-pay

## 2019-12-05 VITALS — BP 110/69 | HR 66 | Temp 99.0°F | Resp 20

## 2019-12-05 DIAGNOSIS — D500 Iron deficiency anemia secondary to blood loss (chronic): Secondary | ICD-10-CM | POA: Diagnosis not present

## 2019-12-05 DIAGNOSIS — N920 Excessive and frequent menstruation with regular cycle: Secondary | ICD-10-CM | POA: Diagnosis not present

## 2019-12-05 DIAGNOSIS — D509 Iron deficiency anemia, unspecified: Secondary | ICD-10-CM

## 2019-12-05 MED ORDER — SODIUM CHLORIDE 0.9 % IV SOLN
Freq: Once | INTRAVENOUS | Status: AC
  Filled 2019-12-05: qty 250

## 2019-12-05 MED ORDER — SODIUM CHLORIDE 0.9 % IV SOLN
125.0000 mg | Freq: Once | INTRAVENOUS | Status: AC
  Administered 2019-12-05: 125 mg via INTRAVENOUS
  Filled 2019-12-05: qty 10

## 2019-12-05 NOTE — Patient Instructions (Signed)
Sodium Ferric Gluconate Complex injection What is this medicine? SODIUM FERRIC GLUCONATE COMPLEX (SOE dee um FER ik GLOO koe nate KOM pleks) is an iron replacement. It is used with epoetin therapy to treat low iron levels in patients who are receiving hemodialysis. This medicine may be used for other purposes; ask your health care provider or pharmacist if you have questions. COMMON BRAND NAME(S): Ferrlecit, Nulecit What should I tell my health care provider before I take this medicine? They need to know if you have any of the following conditions:  anemia that is not from iron deficiency  high levels of iron in the body  an unusual or allergic reaction to iron, benzyl alcohol, other medicines, foods, dyes, or preservatives  pregnant or are trying to become pregnant  breast-feeding How should I use this medicine? This medicine is for infusion into a vein. It is given by a health care professional in a hospital or clinic setting. Talk to your pediatrician regarding the use of this medicine in children. While this drug may be prescribed for children as young as 6 years old for selected conditions, precautions do apply. Overdosage: If you think you have taken too much of this medicine contact a poison control center or emergency room at once. NOTE: This medicine is only for you. Do not share this medicine with others. What if I miss a dose? It is important not to miss your dose. Call your doctor or health care professional if you are unable to keep an appointment. What may interact with this medicine? Do not take this medicine with any of the following medications:  deferoxamine  dimercaprol  other iron products This medicine may also interact with the following medications:  chloramphenicol  deferasirox  medicine for blood pressure like enalapril This list may not describe all possible interactions. Give your health care provider a list of all the medicines, herbs,  non-prescription drugs, or dietary supplements you use. Also tell them if you smoke, drink alcohol, or use illegal drugs. Some items may interact with your medicine. What should I watch for while using this medicine? Your condition will be monitored carefully while you are receiving this medicine. Visit your doctor for check-ups as directed. What side effects may I notice from receiving this medicine? Side effects that you should report to your doctor or health care professional as soon as possible:  allergic reactions like skin rash, itching or hives, swelling of the face, lips, or tongue  breathing problems  changes in hearing  changes in vision  chills, flushing, or sweating  fast, irregular heartbeat  feeling faint or lightheaded, falls  fever, flu-like symptoms  high or low blood pressure  pain, tingling, numbness in the hands or feet  severe pain in the chest, back, flanks, or groin  swelling of the ankles, feet, hands  trouble passing urine or change in the amount of urine  unusually weak or tired Side effects that usually do not require medical attention (report to your doctor or health care professional if they continue or are bothersome):  cramps  dark colored stools  diarrhea  headache  nausea, vomiting  stomach upset This list may not describe all possible side effects. Call your doctor for medical advice about side effects. You may report side effects to FDA at 1-800-FDA-1088. Where should I keep my medicine? This drug is given in a hospital or clinic and will not be stored at home. NOTE: This sheet is a summary. It may not cover all   possible information. If you have questions about this medicine, talk to your doctor, pharmacist, or health care provider.  2020 Elsevier/Gold Standard (2007-09-27 15:58:57)  

## 2019-12-05 NOTE — Progress Notes (Signed)
Pt. declines to stay for 30 minutes post observation, states she has been tolerating treatment well. Vitals signs stable, left via ambulation no respiratory distress noted.

## 2019-12-12 ENCOUNTER — Ambulatory Visit: Payer: 59

## 2020-02-15 ENCOUNTER — Other Ambulatory Visit: Payer: 59

## 2020-02-15 ENCOUNTER — Ambulatory Visit: Payer: 59 | Admitting: Oncology

## 2020-03-20 ENCOUNTER — Other Ambulatory Visit: Payer: Self-pay | Admitting: Diagnostic Radiology

## 2020-03-20 DIAGNOSIS — D250 Submucous leiomyoma of uterus: Secondary | ICD-10-CM

## 2020-03-21 ENCOUNTER — Inpatient Hospital Stay: Payer: 59 | Attending: Oncology

## 2020-03-21 ENCOUNTER — Inpatient Hospital Stay: Payer: 59 | Admitting: Oncology

## 2020-03-21 ENCOUNTER — Other Ambulatory Visit: Payer: Self-pay

## 2020-03-21 VITALS — BP 133/76 | HR 79 | Temp 97.7°F | Resp 20 | Ht 66.0 in | Wt 266.3 lb

## 2020-03-21 DIAGNOSIS — D509 Iron deficiency anemia, unspecified: Secondary | ICD-10-CM

## 2020-03-21 DIAGNOSIS — D500 Iron deficiency anemia secondary to blood loss (chronic): Secondary | ICD-10-CM | POA: Diagnosis not present

## 2020-03-21 DIAGNOSIS — D259 Leiomyoma of uterus, unspecified: Secondary | ICD-10-CM | POA: Diagnosis not present

## 2020-03-21 DIAGNOSIS — N920 Excessive and frequent menstruation with regular cycle: Secondary | ICD-10-CM | POA: Insufficient documentation

## 2020-03-21 LAB — CBC WITH DIFFERENTIAL (CANCER CENTER ONLY)
Abs Immature Granulocytes: 0.01 10*3/uL (ref 0.00–0.07)
Basophils Absolute: 0.1 10*3/uL (ref 0.0–0.1)
Basophils Relative: 1 %
Eosinophils Absolute: 0.1 10*3/uL (ref 0.0–0.5)
Eosinophils Relative: 2 %
HCT: 31.9 % — ABNORMAL LOW (ref 36.0–46.0)
Hemoglobin: 9.6 g/dL — ABNORMAL LOW (ref 12.0–15.0)
Immature Granulocytes: 0 %
Lymphocytes Relative: 30 %
Lymphs Abs: 2.1 10*3/uL (ref 0.7–4.0)
MCH: 22.6 pg — ABNORMAL LOW (ref 26.0–34.0)
MCHC: 30.1 g/dL (ref 30.0–36.0)
MCV: 75.2 fL — ABNORMAL LOW (ref 80.0–100.0)
Monocytes Absolute: 0.4 10*3/uL (ref 0.1–1.0)
Monocytes Relative: 6 %
Neutro Abs: 4.2 10*3/uL (ref 1.7–7.7)
Neutrophils Relative %: 61 %
Platelet Count: 354 10*3/uL (ref 150–400)
RBC: 4.24 MIL/uL (ref 3.87–5.11)
RDW: 16.6 % — ABNORMAL HIGH (ref 11.5–15.5)
WBC Count: 6.9 10*3/uL (ref 4.0–10.5)
nRBC: 0 % (ref 0.0–0.2)

## 2020-03-21 NOTE — Progress Notes (Signed)
Hematology and Oncology Follow Up   Lori Lara [id number] [birth year] 45 y.o. 03/21/2020 3:01 PM Baxley, [name], MDBaxley, [name], MD       Principle Diagnosis:  45 year old woman with iron deficiency anemia diagnosed in July 2018.  Her anemia is related to chronic menstrual blood losses from uterine fibroids    Prior Therapy:  She is status post Feraheme infusion in December 2018.   She is status post Venofer infusion completed on April 20, 2019 for a total of 1000 mg.  She is status post ferric gluconate for a total of 500 mg completed October 2021.   Current therapy:  Repeat intravenous iron is needed.    Interim History: Lori Lara returns today for a follow-up.  Since the last visit, she reports no major changes in her health.  She tolerated the IV iron infusion in October 2021 without any complications.  She has reported some slight increase in her energy and decline in her fatigue.  Her menstrual cycles are more regular at this time and reported less menorrhagia.  She denies any hematochezia melena or hemoptysis.  He denies any dyspnea on exertion or fatigue.      Medications: Unchanged on review. Current Outpatient Medications  Medication Sig Dispense Refill  . docusate sodium (COLACE) 100 MG capsule Take 1 capsule (100 mg total) by mouth daily. For 7 days 7 capsule 0  . ergocalciferol (DRISDOL) 1.25 MG (50000 UT) capsule Take 1 capsule (50,000 Units total) by mouth once a week. 12 capsule 3  . ferrous sulfate 220 (44 Fe) MG/5ML solution Take 220 mg by mouth daily with breakfast.     No current facility-administered medications for this visit.     Allergies: No Known Allergies  Physical exam:  Blood pressure 133/76, pulse 79, temperature 97.7 F (36.5 C), temperature source Tympanic, resp. rate 20, height 5\' 6"  (1.676 m), weight 266 lb 4.8 oz (120.8 kg), SpO2 100 %.    ECOG 0    General appearance: Comfortable appearing without any  discomfort Head: Normocephalic without any trauma Oropharynx: Mucous membranes are moist and pink without any thrush or ulcers. Eyes: Pupils are equal and round reactive to light. Lymph nodes: No cervical, supraclavicular, inguinal or axillary lymphadenopathy.   Heart:regular rate and rhythm.  S1 and S2 without leg edema. Lung: Clear without any rhonchi or wheezes.  No dullness to percussion. Abdomin: Soft, nontender, nondistended with good bowel sounds.  No hepatosplenomegaly. Musculoskeletal: No joint deformity or effusion.  Full range of motion noted. Neurological: No deficits noted on motor, sensory and deep tendon reflex exam. Skin: No petechial rash or dryness.  Appeared moist.       Lab Results: Lab Results  Component Value Date   WBC 5.6 11/14/2019   HGB 7.0 (L) 11/14/2019   HCT 26.5 (L) 11/14/2019   MCV 67.6 (L) 11/14/2019   PLT 338 11/14/2019     Chemistry      Component Value Date/Time   NA 138 10/18/2019 0943   K 4.9 10/18/2019 0943   CL 106 10/18/2019 0943   CO2 25 10/18/2019 0943   BUN 12 10/18/2019 0943   CREATININE 0.69 10/18/2019 0943      Component Value Date/Time   CALCIUM 8.9 10/18/2019 0943   ALKPHOS 50 02/23/2019 0828   AST 10 10/18/2019 0943   AST 14 (L) 02/23/2019 0828   ALT 8 10/18/2019 0943   ALT 11 02/23/2019 0828   BILITOT 0.5 10/18/2019 0943   BILITOT 0.5  02/23/2019 0828      Results for Lori Lara (MRN [id number]) as of 03/21/2020 15:04  Ref. Range 11/14/2019 08:09  Iron Latest Ref Range: 41 - 142 ug/dL 11 (L)  UIBC Latest Ref Range: 120 - 384 ug/dL 373  TIBC Latest Ref Range: 236 - 444 ug/dL 384  Saturation Ratios Latest Ref Range: 21 - 57 % 3 (L)  Ferritin Latest Ref Range: 11 - 307 ng/mL <4 (L)       Impression and Plan:  45 year old woman with:   1. Iron deficiency anemia related to chronic menstrual blood losses diagnosed in 2018.  She is status post multiple IV iron infusion in the past.  She received  Venofer and ferric gluconate on 2 separate occasions in 2021.  Laboratory data in the last year were reviewed and risks and benefits of repeat intravenous iron were discussed.  Advised that his continues to show persistent iron deficiency despite repeat intravenous iron infusion utilizing 2 different formulations, she would require repeat infusion with Feraheme.  Her hemoglobin today is improved to 9.6 but remains abnormal with low MCV and RDW.  Iron studies are currently pending.  She has clearly did not respond to iron sucrose and ferric gluconate and does require Feraheme formulations specifically.   2.  Menorrhagia: She is status post embolization of uterine fibroids with improvement in her menstrual cycles.   3. follow-up: In 3 months for repeat follow-up.   30  minutes were dedicated to this visit.  The time spent on reviewing disease status, laboratory data discussion and treatment choices for the future.   Zola Button, MD 03/21/2020 3:01 PM

## 2020-03-24 ENCOUNTER — Other Ambulatory Visit: Payer: Self-pay | Admitting: Oncology

## 2020-03-24 ENCOUNTER — Telehealth: Payer: Self-pay | Admitting: Oncology

## 2020-03-24 ENCOUNTER — Telehealth: Payer: Self-pay

## 2020-03-24 LAB — FERRITIN: Ferritin: <4 ng/mL — ABNORMAL LOW (ref 11–307)

## 2020-03-24 LAB — IRON AND TIBC
Iron: 24 ug/dL — ABNORMAL LOW (ref 41–142)
Saturation Ratios: 6 % — ABNORMAL LOW (ref 21–57)
TIBC: 385 ug/dL (ref 236–444)
UIBC: 362 ug/dL (ref 120–384)

## 2020-03-24 NOTE — Telephone Encounter (Signed)
Left message that iron levels are still low and communicated scheduled appt's for iron transfusions.

## 2020-03-24 NOTE — Telephone Encounter (Signed)
Scheduled appt per 2/14 sch msg- left message for patient with appt date and time

## 2020-03-24 NOTE — Telephone Encounter (Signed)
-----   Message from Wyatt Portela, MD sent at 03/24/2020  9:41 AM EST ----- Please let her know her iron still low and will need more IV iron. Message also sent to scheduling.

## 2020-04-03 ENCOUNTER — Other Ambulatory Visit: Payer: Self-pay | Admitting: Oncology

## 2020-04-04 ENCOUNTER — Other Ambulatory Visit: Payer: Self-pay

## 2020-04-04 ENCOUNTER — Inpatient Hospital Stay: Payer: 59

## 2020-04-04 VITALS — BP 122/70 | HR 67 | Temp 97.9°F | Resp 17

## 2020-04-04 DIAGNOSIS — D500 Iron deficiency anemia secondary to blood loss (chronic): Secondary | ICD-10-CM | POA: Diagnosis not present

## 2020-04-04 DIAGNOSIS — D259 Leiomyoma of uterus, unspecified: Secondary | ICD-10-CM | POA: Diagnosis not present

## 2020-04-04 DIAGNOSIS — D509 Iron deficiency anemia, unspecified: Secondary | ICD-10-CM

## 2020-04-04 DIAGNOSIS — N920 Excessive and frequent menstruation with regular cycle: Secondary | ICD-10-CM | POA: Diagnosis not present

## 2020-04-04 MED ORDER — SODIUM CHLORIDE 0.9 % IV SOLN
Freq: Once | INTRAVENOUS | Status: AC
  Filled 2020-04-04: qty 250

## 2020-04-04 MED ORDER — SODIUM CHLORIDE 0.9 % IV SOLN
125.0000 mg | Freq: Once | INTRAVENOUS | Status: AC
  Administered 2020-04-04: 125 mg via INTRAVENOUS
  Filled 2020-04-04 (×2): qty 10

## 2020-04-04 NOTE — Patient Instructions (Signed)
Sodium Ferric Gluconate Complex injection What is this medicine? SODIUM FERRIC GLUCONATE COMPLEX (SOE dee um FER ik GLOO koe nate KOM pleks) is an iron replacement. It is used with epoetin therapy to treat low iron levels in patients who are receiving hemodialysis. This medicine may be used for other purposes; ask your health care provider or pharmacist if you have questions. COMMON BRAND NAME(S): Ferrlecit, Nulecit What should I tell my health care provider before I take this medicine? They need to know if you have any of the following conditions:  anemia that is not from iron deficiency  high levels of iron in the body  an unusual or allergic reaction to iron, benzyl alcohol, other medicines, foods, dyes, or preservatives  pregnant or are trying to become pregnant  breast-feeding How should I use this medicine? This medicine is for infusion into a vein. It is given by a health care professional in a hospital or clinic setting. Talk to your pediatrician regarding the use of this medicine in children. While this drug may be prescribed for children as young as 6 years old for selected conditions, precautions do apply. Overdosage: If you think you have taken too much of this medicine contact a poison control center or emergency room at once. NOTE: This medicine is only for you. Do not share this medicine with others. What if I miss a dose? It is important not to miss your dose. Call your doctor or health care professional if you are unable to keep an appointment. What may interact with this medicine? Do not take this medicine with any of the following medications:  deferoxamine  dimercaprol  other iron products This medicine may also interact with the following medications:  chloramphenicol  deferasirox  medicine for blood pressure like enalapril This list may not describe all possible interactions. Give your health care provider a list of all the medicines, herbs,  non-prescription drugs, or dietary supplements you use. Also tell them if you smoke, drink alcohol, or use illegal drugs. Some items may interact with your medicine. What should I watch for while using this medicine? Your condition will be monitored carefully while you are receiving this medicine. Visit your doctor for check-ups as directed. What side effects may I notice from receiving this medicine? Side effects that you should report to your doctor or health care professional as soon as possible:  allergic reactions like skin rash, itching or hives, swelling of the face, lips, or tongue  breathing problems  changes in hearing  changes in vision  chills, flushing, or sweating  fast, irregular heartbeat  feeling faint or lightheaded, falls  fever, flu-like symptoms  high or low blood pressure  pain, tingling, numbness in the hands or feet  severe pain in the chest, back, flanks, or groin  swelling of the ankles, feet, hands  trouble passing urine or change in the amount of urine  unusually weak or tired Side effects that usually do not require medical attention (report to your doctor or health care professional if they continue or are bothersome):  cramps  dark colored stools  diarrhea  headache  nausea, vomiting  stomach upset This list may not describe all possible side effects. Call your doctor for medical advice about side effects. You may report side effects to FDA at 1-800-FDA-1088. Where should I keep my medicine? This drug is given in a hospital or clinic and will not be stored at home. NOTE: This sheet is a summary. It may not cover all   possible information. If you have questions about this medicine, talk to your doctor, pharmacist, or health care provider.  2020 Elsevier/Gold Standard (2007-09-27 15:58:57)  

## 2020-04-08 ENCOUNTER — Ambulatory Visit (HOSPITAL_COMMUNITY)
Admission: RE | Admit: 2020-04-08 | Discharge: 2020-04-08 | Disposition: A | Discharge status: Home / Self Care | Payer: 59 | Source: Ambulatory Visit | Attending: Diagnostic Radiology | Admitting: Diagnostic Radiology

## 2020-04-08 DIAGNOSIS — N852 Hypertrophy of uterus: Secondary | ICD-10-CM | POA: Diagnosis not present

## 2020-04-08 DIAGNOSIS — D250 Submucous leiomyoma of uterus: Secondary | ICD-10-CM | POA: Diagnosis not present

## 2020-04-08 DIAGNOSIS — D259 Leiomyoma of uterus, unspecified: Secondary | ICD-10-CM | POA: Diagnosis not present

## 2020-04-08 MED ORDER — GADOBUTROL 1 MMOL/ML IV SOLN
10.0000 mL | Freq: Once | INTRAVENOUS | Status: AC | PRN
  Administered 2020-04-08: 10 mL via INTRAVENOUS

## 2020-04-10 ENCOUNTER — Ambulatory Visit
Admission: RE | Admit: 2020-04-10 | Discharge: 2020-04-10 | Disposition: A | Discharge status: Home / Self Care | Payer: 59 | Source: Ambulatory Visit | Attending: Diagnostic Radiology | Admitting: Diagnostic Radiology

## 2020-04-10 ENCOUNTER — Encounter: Payer: Self-pay | Admitting: *Deleted

## 2020-04-10 ENCOUNTER — Other Ambulatory Visit: Payer: Self-pay

## 2020-04-10 DIAGNOSIS — D250 Submucous leiomyoma of uterus: Secondary | ICD-10-CM

## 2020-04-10 DIAGNOSIS — D259 Leiomyoma of uterus, unspecified: Secondary | ICD-10-CM | POA: Diagnosis not present

## 2020-04-10 HISTORY — PX: IR RADIOLOGIST EVAL & MGMT: IMG5224

## 2020-04-10 NOTE — Progress Notes (Signed)
Chief Complaint: Patient was consulted remotely today (TeleHealth) for uterine artery embolization follow-up.  Referring Physician(s): Garwin Brothers, [name]  History of Present Illness: Lori Lara is a 45 y.o. female with history of uterine fibroids, menorrhagia and iron deficiency anemia.  Patient underwent successful uterine artery embolization procedure on 10/03/2019.  Since the embolization, her menstrual bleeding has markedly decreased.  She says that it is "10 times better" than it was prior to the procedure.  She continues to have regular periods that last approximately 5 days.  Most of the days are light bleeding and 1 day is slightly heavy.  The urinary urgency has also decreased since the procedure.  Patient is still seeing Dr. Alen Blew for her iron deficiency anemia.  Hemoglobin at the day of her fibroid treatment was 6.8 and her most recent CBC on 03/21/2020 was 9.6.  No significant pain or cramping during her menstrual periods.  No new medical issues.   Past Medical History:  Diagnosis Date  . Anemia   . Vitamin D deficiency     Past Surgical History:  Procedure Laterality Date  . IR ANGIOGRAM PELVIS SELECTIVE OR SUPRASELECTIVE  10/03/2019  . IR ANGIOGRAM SELECTIVE EACH ADDITIONAL VESSEL  10/03/2019  . IR ANGIOGRAM SELECTIVE EACH ADDITIONAL VESSEL  10/03/2019  . IR EMBO TUMOR ORGAN ISCHEMIA INFARCT INC GUIDE ROADMAPPING  10/03/2019  . IR RADIOLOGIST EVAL & MGMT  07/11/2019  . IR RADIOLOGIST EVAL & MGMT  08/21/2019  . IR RADIOLOGIST EVAL & MGMT  11/06/2019  . IR US GUIDE VASC ACCESS RIGHT  10/03/2019    Allergies: Patient has no known allergies.  Medications: Prior to Admission medications   Medication Sig Start Date End Date Taking? Authorizing Provider  docusate sodium (COLACE) 100 MG capsule Take 1 capsule (100 mg total) by mouth daily. For 7 days 10/04/19   Ronney Lion, PA-C  ergocalciferol (DRISDOL) 1.25 MG (50000 UT) capsule Take 1 capsule (50,000  Units total) by mouth once a week. 10/18/18   Elby Showers, MD  ferrous sulfate 220 (44 Fe) MG/5ML solution Take 220 mg by mouth daily with breakfast.    [provider]     No family history on file.  Social History   Socioeconomic History  . Marital status: Single    Spouse name: Not on file  . Number of children: Not on file  . Years of education: Not on file  . Highest education level: Not on file  Occupational History  . Not on file  Tobacco Use  . Smoking status: Never Smoker  . Smokeless tobacco: Never Used  Substance and Sexual Activity  . Alcohol use: No  . Drug use: No  . Sexual activity: Not on file  Other Topics Concern  . Not on file  Social History Narrative  . Not on file   Social Determinants of Health   Financial Resource Strain: Not on file  Food Insecurity: Not on file  Transportation Needs: Not on file  Physical Activity: Not on file  Stress: Not on file  Social Connections: Not on file    Review of Systems  Constitutional: Negative.   Genitourinary: Positive for menstrual problem and urgency.    Physical Exam No direct physical exam was performed   Vital Signs: There were no vitals taken for this visit.  Imaging: MR PELVIS W WO CONTRAST  Result Date: 04/09/2020 CLINICAL DATA:  Follow-up uterine fibroids. 6 months status post uterine artery embolization. EXAM: MRI PELVIS WITHOUT AND  WITH CONTRAST TECHNIQUE: Multiplanar multisequence MR imaging of the pelvis was performed both before and after administration of intravenous contrast. CONTRAST:  16mL GADAVIST GADOBUTROL 1 MMOL/ML IV SOLN COMPARISON:  07/27/2019 FINDINGS: Lower Urinary Tract: No bladder or urethral abnormality identified. Bowel:  Unremarkable visualized pelvic bowel loops. Vascular/Lymphatic: No pathologically enlarged lymph nodes or other significant abnormality. Reproductive: -- Uterus: Measures 13.1 x 7.3 x 9.1 cm (volume = 460 cm^3, compared to 660 mL on prior exam).  Multiple uterine fibroids are again seen which involve the uterus diffusely. These are mostly intramural and subserosal in location. The largest in the left anterior corpus measures 4.1 x 3.3 by 3.4 cm, compared to 5.4 x 5.0 by 4.2 cm previously. -- Intracavitary fibroids: Aberration is suboptimal due to lack of fluid in the endometrial cavity on today's study. A 1 cm submucosal or intracavitary fibroid is again noted in the uterine fundus on image 22/6, without significant change. -- Pedunculated fibroids: None. -- Fibroid contrast enhancement: All fibroids show near complete lack of contrast enhancement on the current exam. -- Right ovary:  Appears normal.  No mass identified. -- Left ovary:  Appears normal.  No mass identified. Other: No abnormal free fluid. Musculoskeletal:  Unremarkable. IMPRESSION: Decreased uterine volume and size of individual fibroids since previous study, with lack of fibroid contrast enhancement. No new or enlarging uterine masses identified. Persistent 1 cm submucosal or intracavitary fibroid in the uterine fundus, unchanged. Normal appearance of both ovaries. No adnexal mass identified. Electronically Signed   By: Marlaine Hind M.D.   On: 04/09/2020 14:20    Labs:  CBC: Recent Labs    10/03/19 1235 10/18/19 0943 11/14/19 0809 03/21/20 1459  WBC 5.5 7.4 5.6 6.9  HGB 6.8* 6.7* 7.0* 9.6*  HCT 25.4* 25.3* 26.5* 31.9*  PLT 293 600* 338 354    COAGS: Recent Labs    10/03/19 1235  INR 1.0    BMP: Recent Labs    10/03/19 1235 10/18/19 0943  NA 136 138  K 3.8 4.9  CL 108 106  CO2 22 25  GLUCOSE 91 80  BUN 10 12  CALCIUM 8.7* 8.9  CREATININE 0.64 0.69  GFRNONAA >60 106  GFRAA >60 123    LIVER FUNCTION TESTS: Recent Labs    10/18/19 0943  BILITOT 0.5  AST 10  ALT 8  PROT 6.6    TUMOR MARKERS: No results for input(s): AFPTM, CEA, CA199, CHROMGRNA in the last 8760 hours.  Assessment and Plan:  45 year old with uterine fibroids, history of  menorrhagia and iron deficiency anemia.  Patient had a successful uterine artery embolization procedure on 10/03/2019.  Patient's menstrual bleeding has markedly decreased since the procedure and she is very happy with the outcome.  Hemoglobin is increased compared to her pretreatment levels.  MRI confirms excellent result from the uterine artery embolization procedure.  Uterus has decreased in size from 660 mL to 460 mL.  Decreased size of the individual fibroids and lack of contrast enhancement within the fibroids.  MRI findings correspond with the patient's improved clinical picture.  Patient will continue to follow-up with Dr. Garwin Brothers.  Patient can follow-up with IR as needed.    Electronically Signed: Burman Riis 04/10/2020, 2:07 PM   I spent a total of    10 Minutes in remote  clinical consultation, greater than 50% of which was counseling/coordinating care for uterine fibroids.    Visit type: Audio only (telephone). Audio (no video) only due to Patient preference.. Alternative for  in-person consultation at [facility], [address] [place], [place], [place]. This visit type was conducted due to national recommendations for restrictions regarding the COVID-19 Pandemic (e.g. social distancing).  This format is felt to be most appropriate for this patient at this time.  All issues noted in this document were discussed and addressed.  Patient ID: Lori Lara, female   DOB: 08-19-1975, 45 y.o.   MRN: [id number]

## 2020-04-11 ENCOUNTER — Ambulatory Visit: Payer: 59

## 2020-04-17 ENCOUNTER — Telehealth: Payer: Self-pay | Admitting: Oncology

## 2020-04-17 ENCOUNTER — Telehealth: Payer: Self-pay

## 2020-04-17 NOTE — Telephone Encounter (Signed)
Received call from patient that she got a letter from her insurance company that her ordered iron is not covered. TCT patient that this has been resolved and she is scheduled to still get her iron infusion tomorrow at 3:30.

## 2020-04-17 NOTE — Telephone Encounter (Signed)
Rescheduled infusion appointment per 3/10 schedule message. Patient is aware of changes.

## 2020-04-18 ENCOUNTER — Inpatient Hospital Stay: Payer: 59

## 2020-04-24 ENCOUNTER — Telehealth: Payer: Self-pay | Admitting: Oncology

## 2020-04-24 NOTE — Telephone Encounter (Signed)
Called patient regarding 03/17 scheduled message, left a voicemail.

## 2020-04-28 ENCOUNTER — Telehealth: Payer: Self-pay | Admitting: Oncology

## 2020-04-28 NOTE — Telephone Encounter (Signed)
Per sch msg, patient wants to r/s 3/25 appt to later time. Called patient. Left msg informing that we are at capacity in infusion and that I can check and see about our sister facilities fitting her in. Advised to give me a call back with her decision. No changes were mad to her appt

## 2020-04-30 ENCOUNTER — Telehealth: Payer: Self-pay

## 2020-04-30 NOTE — Telephone Encounter (Signed)
Called started with vertigo yesterday morning. Last night felt like the room was spinning lasted for about 5 minutes. She wants something in the morning. She made an appointment for 11:00am for tomorrow.

## 2020-05-01 ENCOUNTER — Ambulatory Visit: Payer: 59 | Admitting: Internal Medicine

## 2020-05-01 ENCOUNTER — Other Ambulatory Visit: Payer: Self-pay | Admitting: Internal Medicine

## 2020-05-01 ENCOUNTER — Other Ambulatory Visit: Payer: Self-pay

## 2020-05-01 ENCOUNTER — Encounter: Payer: Self-pay | Admitting: Internal Medicine

## 2020-05-01 VITALS — BP 110/78 | HR 78 | Temp 97.7°F | Ht 66.0 in | Wt 257.0 lb

## 2020-05-01 DIAGNOSIS — D500 Iron deficiency anemia secondary to blood loss (chronic): Secondary | ICD-10-CM | POA: Diagnosis not present

## 2020-05-01 DIAGNOSIS — Z8742 Personal history of other diseases of the female genital tract: Secondary | ICD-10-CM | POA: Diagnosis not present

## 2020-05-01 DIAGNOSIS — H8110 Benign paroxysmal vertigo, unspecified ear: Secondary | ICD-10-CM | POA: Diagnosis not present

## 2020-05-01 MED ORDER — MECLIZINE HCL 25 MG PO TABS: 25.0000 mg | ORAL_TABLET | Freq: Three times a day (TID) | ORAL | 1 refills | Status: DC | PRN

## 2020-05-01 MED FILL — MECLIZINE 25 MG TABLET: 25 | 10 days supply | Qty: 30 | Fill #0

## 2020-05-01 NOTE — Progress Notes (Signed)
   Subjective:    Patient ID: Lori Lara, female    DOB: August 15, 1975, 45 y.o.   MRN: [id number]  HPI 45 year old Female seen for dizziness that started yesterday morning. Felt like room was spinning and episode lasted about 5 minutes. Wanted to be seen today.  No prior history of vertigo.  History of iron deficiency anemia.  She sees Dr. Alen Blew for this and his iron infusions.  I records indicate she has had 2 COVID-19 immunizations.  She has no fever chills or dysgeusia no symptoms of COVID-19.  No respiratory infection symptoms.  No vomiting or diarrhea.  In February, her hemoglobin was 9.6 g and previously had been 7 g in October 2021.  Has been as low as 6.7 g in September 2021.  Iron level in February was low at 24 and had been 11 in October 2021.  Ferritin in February was less than 4.    Review of Systems see above     Objective:   Physical Exam Blood pressure lying is 120/80 pulse 83  Blood pressure sitting is 130/90 pulse 92  Blood pressure standing is 120/80 with pulse of 94.  Cranial nerves II through XII are grossly intact.  PERRLA.  Muscle strength is normal.  Deep tendon reflexes within normal limits.  She is alert and oriented x3.  She does have nystagmus.     Assessment & Plan:  Benign positional vertigo  History of iron deficiency anemia  History of menorrhagia  Plan: Reviewed Hematology notes.  She does need to continue with iron infusions.  Explained that she has benign positional vertigo noted.  Could be triggered by stress, viral infection or unknown cause.  Anemia probably does not help issue.  I have prescribed Antivert 25 mg to take up to 3 times daily as needed.  She should contact hematology about another iron infusion.

## 2020-05-01 NOTE — Patient Instructions (Signed)
Take meclizine 25 mg up to 3 times daily for several days.  Careful with position change as it may trigger vertigo.  Please schedule for iron infusion tomorrow.

## 2020-05-02 ENCOUNTER — Inpatient Hospital Stay: Payer: 59 | Attending: Oncology

## 2020-05-02 VITALS — BP 111/81 | HR 71 | Temp 98.4°F | Resp 18 | Wt 262.5 lb

## 2020-05-02 DIAGNOSIS — D500 Iron deficiency anemia secondary to blood loss (chronic): Secondary | ICD-10-CM | POA: Diagnosis not present

## 2020-05-02 DIAGNOSIS — D259 Leiomyoma of uterus, unspecified: Secondary | ICD-10-CM | POA: Insufficient documentation

## 2020-05-02 DIAGNOSIS — N920 Excessive and frequent menstruation with regular cycle: Secondary | ICD-10-CM | POA: Diagnosis not present

## 2020-05-02 DIAGNOSIS — D509 Iron deficiency anemia, unspecified: Secondary | ICD-10-CM

## 2020-05-02 MED ORDER — SODIUM CHLORIDE 0.9 % IV SOLN
125.0000 mg | Freq: Once | INTRAVENOUS | Status: AC
  Administered 2020-05-02: 125 mg via INTRAVENOUS
  Filled 2020-05-02: qty 10

## 2020-05-02 MED ORDER — SODIUM CHLORIDE 0.9 % IV SOLN
Freq: Once | INTRAVENOUS | Status: AC
  Filled 2020-05-02: qty 250

## 2020-05-02 NOTE — Patient Instructions (Signed)
Iron Dextran injection °What is this medicine? °IRON DEXTRAN (AHY ern DEX tran) is an iron complex. Iron is used to make healthy red blood cells, which carry oxygen and nutrients through the body. This medicine is used to treat people who cannot take iron by mouth and have low levels of iron in the blood. °This medicine may be used for other purposes; ask your health care provider or pharmacist if you have questions. °COMMON BRAND NAME(S): Dexferrum, INFeD °What should I tell my health care provider before I take this medicine? °They need to know if you have any of these conditions: °· anemia not caused by low iron levels °· heart disease °· high levels of iron in the blood °· kidney disease °· liver disease °· an unusual or allergic reaction to iron, other medicines, foods, dyes, or preservatives °· pregnant or trying to get pregnant °· breast-feeding °How should I use this medicine? °This medicine is for injection into a vein or a muscle. It is given by a health care professional in a hospital or clinic setting. °Talk to your pediatrician regarding the use of this medicine in children. While this drug may be prescribed for children as young as 4 months old for selected conditions, precautions do apply. °Overdosage: If you think you have taken too much of this medicine contact a poison control center or emergency room at once. °NOTE: This medicine is only for you. Do not share this medicine with others. °What if I miss a dose? °It is important not to miss your dose. Call your doctor or health care professional if you are unable to keep an appointment. °What may interact with this medicine? °Do not take this medicine with any of the following medications: °· deferoxamine °· dimercaprol °· other iron products °This medicine may also interact with the following medications: °· chloramphenicol °· deferasirox °This list may not describe all possible interactions. Give your health care provider a list of all the  medicines, herbs, non-prescription drugs, or dietary supplements you use. Also tell them if you smoke, drink alcohol, or use illegal drugs. Some items may interact with your medicine. °What should I watch for while using this medicine? °Visit your doctor or health care professional regularly. Tell your doctor if your symptoms do not start to get better or if they get worse. You may need blood work done while you are taking this medicine. °You may need to follow a special diet. Talk to your doctor. Foods that contain iron include: whole grains/cereals, dried fruits, beans, or peas, leafy green vegetables, and organ meats (liver, kidney). °Long-term use of this medicine may increase your risk of some cancers. Talk to your doctor about how to limit your risk. °What side effects may I notice from receiving this medicine? °Side effects that you should report to your doctor or health care professional as soon as possible: °· allergic reactions like skin rash, itching or hives, swelling of the face, lips, or tongue °· blue lips, nails, or skin °· breathing problems °· changes in blood pressure °· chest pain °· confusion °· fast, irregular heartbeat °· feeling faint or lightheaded, falls °· fever or chills °· flushing, sweating, or hot feelings °· joint or muscle aches or pains °· pain, tingling, numbness in the hands or feet °· seizures °· unusually weak or tired °Side effects that usually do not require medical attention (report to your doctor or health care professional if they continue or are bothersome): °· change in taste (metallic taste) °·   diarrhea °· headache °· irritation at site where injected °· nausea, vomiting °· stomach upset °This list may not describe all possible side effects. Call your doctor for medical advice about side effects. You may report side effects to FDA at 1-800-FDA-1088. °Where should I keep my medicine? °This drug is given in a hospital or clinic and will not be stored at home. °NOTE: This  sheet is a summary. It may not cover all possible information. If you have questions about this medicine, talk to your doctor, pharmacist, or health care provider. °© 2021 Elsevier/Gold Standard (2007-06-13 16:59:50) ° °

## 2020-06-11 ENCOUNTER — Telehealth: Payer: Self-pay | Admitting: Oncology

## 2020-06-11 NOTE — Telephone Encounter (Signed)
Called pt to r/s appt per 5/4 sch msg. No answer and vm was full, unable to leave a msg. Will try to call again later.

## 2020-06-12 ENCOUNTER — Telehealth: Payer: Self-pay | Admitting: Oncology

## 2020-06-12 ENCOUNTER — Encounter: Payer: Self-pay | Admitting: Oncology

## 2020-06-12 NOTE — Telephone Encounter (Signed)
R/s appts per 5/4 sch msg. Pt aware.

## 2020-06-12 NOTE — Telephone Encounter (Signed)
Called pt again about r/s appts per 5/4 sch msg. Again no answer and VM is full so unable to leave a msg. Will attempt to contact pt one more time.

## 2020-06-19 ENCOUNTER — Other Ambulatory Visit: Payer: 59

## 2020-06-19 ENCOUNTER — Ambulatory Visit: Payer: 59 | Admitting: Oncology

## 2020-06-26 ENCOUNTER — Other Ambulatory Visit: Payer: Self-pay

## 2020-06-26 ENCOUNTER — Inpatient Hospital Stay: Payer: 59 | Attending: Oncology

## 2020-06-26 ENCOUNTER — Inpatient Hospital Stay (HOSPITAL_BASED_OUTPATIENT_CLINIC_OR_DEPARTMENT_OTHER): Payer: 59 | Admitting: Oncology

## 2020-06-26 VITALS — BP 140/67 | HR 73 | Temp 96.5°F | Resp 20 | Wt 266.0 lb

## 2020-06-26 DIAGNOSIS — D509 Iron deficiency anemia, unspecified: Secondary | ICD-10-CM | POA: Diagnosis not present

## 2020-06-26 DIAGNOSIS — D500 Iron deficiency anemia secondary to blood loss (chronic): Secondary | ICD-10-CM | POA: Diagnosis not present

## 2020-06-26 DIAGNOSIS — D259 Leiomyoma of uterus, unspecified: Secondary | ICD-10-CM | POA: Insufficient documentation

## 2020-06-26 LAB — IRON AND TIBC
Iron: 32 ug/dL — ABNORMAL LOW (ref 41–142)
Saturation Ratios: 8 % — ABNORMAL LOW (ref 21–57)
TIBC: 405 ug/dL (ref 236–444)
UIBC: 373 ug/dL (ref 120–384)

## 2020-06-26 LAB — CBC WITH DIFFERENTIAL (CANCER CENTER ONLY)
Abs Immature Granulocytes: 0.02 10*3/uL (ref 0.00–0.07)
Basophils Absolute: 0.1 10*3/uL (ref 0.0–0.1)
Basophils Relative: 1 %
Eosinophils Absolute: 0.2 10*3/uL (ref 0.0–0.5)
Eosinophils Relative: 2 %
HCT: 34 % — ABNORMAL LOW (ref 36.0–46.0)
Hemoglobin: 11 g/dL — ABNORMAL LOW (ref 12.0–15.0)
Immature Granulocytes: 0 %
Lymphocytes Relative: 29 %
Lymphs Abs: 2.1 10*3/uL (ref 0.7–4.0)
MCH: 25.2 pg — ABNORMAL LOW (ref 26.0–34.0)
MCHC: 32.4 g/dL (ref 30.0–36.0)
MCV: 77.8 fL — ABNORMAL LOW (ref 80.0–100.0)
Monocytes Absolute: 0.5 10*3/uL (ref 0.1–1.0)
Monocytes Relative: 8 %
Neutro Abs: 4.2 10*3/uL (ref 1.7–7.7)
Neutrophils Relative %: 60 %
Platelet Count: 339 10*3/uL (ref 150–400)
RBC: 4.37 MIL/uL (ref 3.87–5.11)
RDW: 16.1 % — ABNORMAL HIGH (ref 11.5–15.5)
WBC Count: 7.1 10*3/uL (ref 4.0–10.5)
nRBC: 0 % (ref 0.0–0.2)

## 2020-06-26 LAB — FERRITIN: Ferritin: 4 ng/mL — ABNORMAL LOW (ref 11–307)

## 2020-06-26 NOTE — Progress Notes (Signed)
Hematology and Oncology Follow Up   Lori Lara [id number] 1976-01-07 45 y.o. 06/26/2020 12:36 PM Baxley, Lori Lara, MDBaxley, Lori Lick, MD       Principle Diagnosis:  45 year old woman with iron deficiency anemia related to chronic menstrual blood losses from uterine fibroids diagnosed in July 2018.  =  Prior Therapy:  She is status post Feraheme infusion in December 2018.   She is status post Venofer infusion completed on April 20, 2019 for a total of 1000 mg.  She is status post ferric gluconate for a total of 500 mg completed October 2021.  She is status post ferric gluconate 225 mg on April 04, 2020.   Current therapy:  Active surveillance and repeat IV iron infusion as needed.   Interim History: [name] is here for repeat evaluation.  Since the last visit, she reports no major changes in her health.  She continues to improve her exercise tolerance and overall stamina.  She denies any hematochezia or melena and her menstrual cycles are regular and less heavy at this time.  She denies any recent falls or syncope.  She denies any hospitalization or illnesses.      Medications: Updated on review. Current Outpatient Medications  Medication Sig Dispense Refill  . meclizine (ANTIVERT) 25 MG tablet TAKE 1 TABLET BY MOUTH THREE TIMES DAILY AS NEEDED FOR DIZZINESS. 30 tablet 1   No current facility-administered medications for this visit.     Allergies: No Known Allergies  Physical exam:   Blood pressure 140/67, pulse 73, temperature (!) 96.5 F (35.8 C), temperature source Tympanic, resp. rate 20, weight 266 lb (120.7 kg), SpO2 99 %.    ECOG 0     General appearance: Alert, awake without any distress. Head: Atraumatic without abnormalities Oropharynx: Without any thrush or ulcers. Eyes: No scleral icterus. Lymph nodes: No lymphadenopathy noted in the cervical, supraclavicular, or axillary nodes Heart:regular rate and rhythm, without any murmurs  or gallops.   Lung: Clear to auscultation without any rhonchi, wheezes or dullness to percussion. Abdomin: Soft, nontender without any shifting dullness or ascites. Musculoskeletal: No clubbing or cyanosis. Neurological: No motor or sensory deficits. Skin: No rashes or lesions.        Lab Results: Lab Results  Component Value Date   WBC 6.9 03/21/2020   HGB 9.6 (L) 03/21/2020   HCT 31.9 (L) 03/21/2020   MCV 75.2 (L) 03/21/2020   PLT 354 03/21/2020     Chemistry      Component Value Date/Time   NA 138 10/18/2019 0943   K 4.9 10/18/2019 0943   CL 106 10/18/2019 0943   CO2 25 10/18/2019 0943   BUN 12 10/18/2019 0943   CREATININE 0.69 10/18/2019 0943      Component Value Date/Time   CALCIUM 8.9 10/18/2019 0943   ALKPHOS 50 02/23/2019 0828   AST 10 10/18/2019 0943   AST 14 (L) 02/23/2019 0828   ALT 8 10/18/2019 0943   ALT 11 02/23/2019 0828   BILITOT 0.5 10/18/2019 0943   BILITOT 0.5 02/23/2019 0828      Results for CARNELLA, Lori Lara (MRN [id number]) as of 06/26/2020 12:38  Ref. Range 03/21/2020 14:58  Iron Latest Ref Range: 41 - 142 ug/dL 24 (L)  UIBC Latest Ref Range: 120 - 384 ug/dL 362  TIBC Latest Ref Range: 236 - 444 ug/dL 385  Saturation Ratios Latest Ref Range: 21 - 57 % 6 (L)  Ferritin Latest Ref Range: 11 - 307 ng/mL <4 (L)  Impression and Plan:  45 year old woman with:   1. Iron deficiency anemia diagnosed in 2018.  This is noted related to uterine fibroids causing chronic blood losses.    Disease status was discussed today and treatment options were reviewed.  She has received different formulation of IV iron without any major success at this time and she continues to have chronic deficiency.  Treatment options moving forward were reviewed at this time.  Iron dextran remains a alternative which will offer a higher dose and then more likely replacement compared to other formulation.  Infusion time associated with iron dextran as well as  the high risk of infusion related complications and anaphylaxis was discussed.  Laboratory data from today reviewed and showed a hemoglobin that is improving at this time.  I opted to defer any further intravenous iron infusion for the time being and I recommended continuing oral iron replacement.  Oral maintenance with daily iron would be reasonable at this time and she is agreeable to do that.   2.  Menorrhagia: Related to uterine fibroids she is status post uterine fibroid embolization in August 2021.  This has improved her heavy flow and menstrual cycles appear to be more regular.   3. follow-up: In 4 months for repeat follow-up.   30  minutes were spent on this encounter.  The time was dedicated to reviewing disease status, discussing treatment choices and addressing complications related to her treatment.   Zola Button, MD 06/26/2020 12:36 PM

## 2020-07-10 DIAGNOSIS — D259 Leiomyoma of uterus, unspecified: Secondary | ICD-10-CM | POA: Diagnosis not present

## 2020-07-10 DIAGNOSIS — Z01419 Encounter for gynecological examination (general) (routine) without abnormal findings: Secondary | ICD-10-CM | POA: Diagnosis not present

## 2020-07-31 DIAGNOSIS — Z1231 Encounter for screening mammogram for malignant neoplasm of breast: Secondary | ICD-10-CM | POA: Diagnosis not present

## 2020-10-07 IMAGING — MR MR PELVIS WO/W CM
11 of 15 series · 34 of 48 positions shown · IV contrast (gadavist)
Comparison: None.

CLINICAL DATA: Uterine fibroids. Preprocedural evaluation for
uterine artery embolization.

EXAM:
MRI PELVIS WITHOUT AND WITH CONTRAST
TECHNIQUE: Multiplanar multisequence MR imaging of the pelvis was performed
both before and after administration of intravenous contrast.
CONTRAST:  10mL GADAVIST GADOBUTROL 1 MMOL/ML IV SOLN

[Series 2: T2 · coronal · 6.0mm · 1.56mm/px · 2 of 30 slices shown (1 of 3)]
[im 1/30]
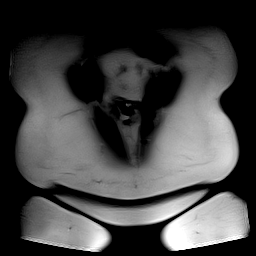
[im 30/30]
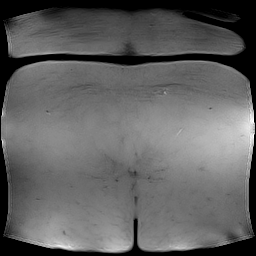

[Series 3: T2 · axial · 5.0mm · 0.51mm/px · z∈[-111,+159]mm · 2 of 46 slices shown (2 of 3)]
[im 1/46]
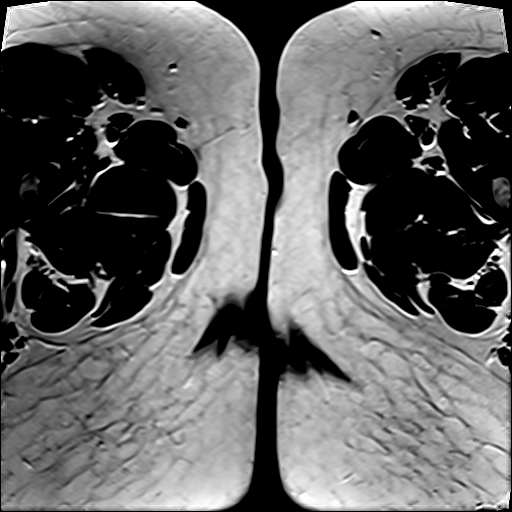
[im 46/46]
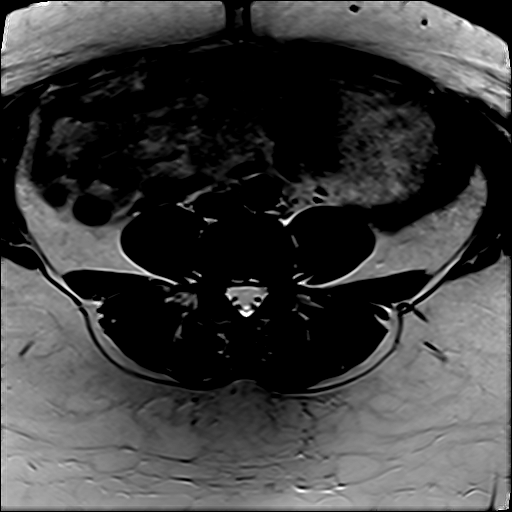

[Series 4: T2 fat-sat · axial · 5.0mm · 0.51mm/px · z∈[-75,+165]mm · 2 of 41 slices shown]
[im 1/41]
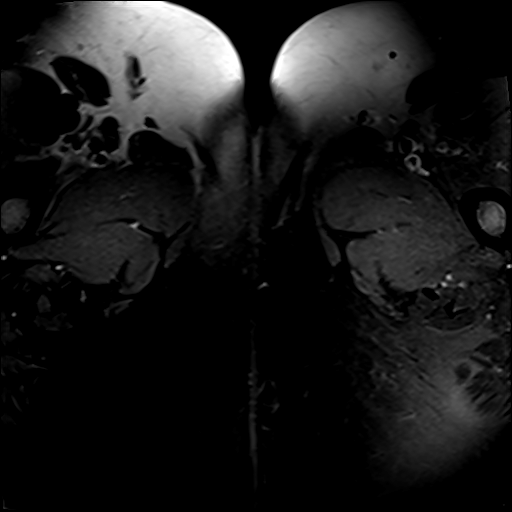
[im 41/41]
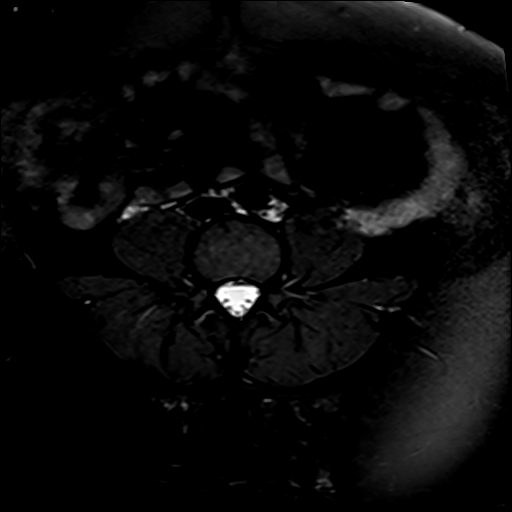

[Series 5: T2 · sagittal · 5.0mm · 0.55mm/px · 2 of 40 slices shown (3 of 3)]
[im 1/40]
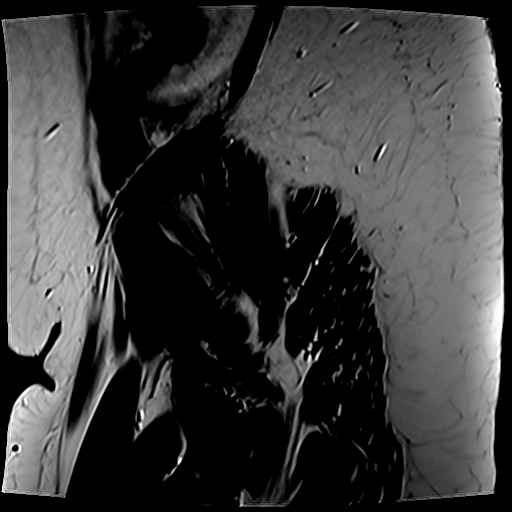
[im 40/40]
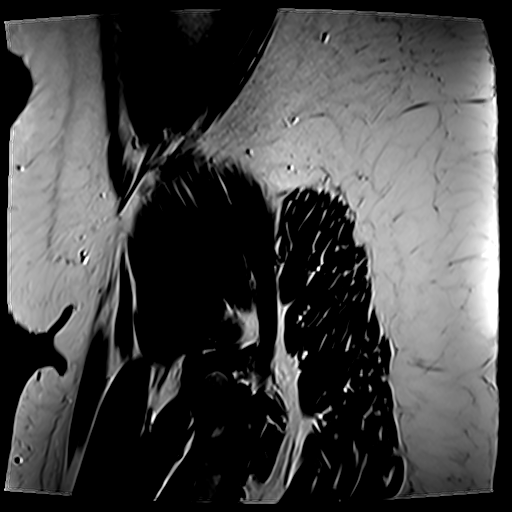

[Series 8: T1 · axial · 4.0mm · 0.84mm/px · z∈[-85,+199]mm · 4 of 72 slices shown (1 of 2)]
[im 1/72]
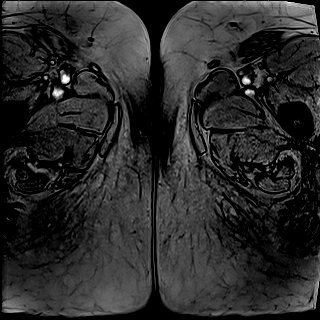
[im 24/72]
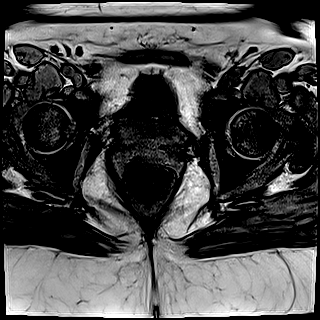
[im 48/72]
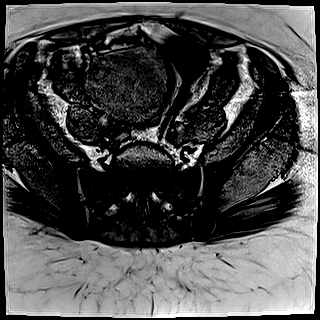
[im 72/72]
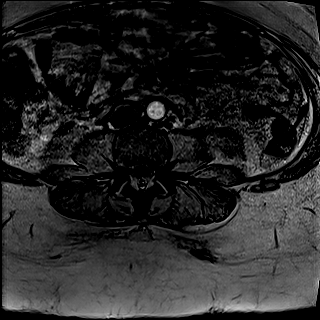

[Series 9: T1 · axial · 4.0mm · 0.84mm/px · z∈[-85,+199]mm · 4 of 72 slices shown (2 of 2)]
[im 1/72]
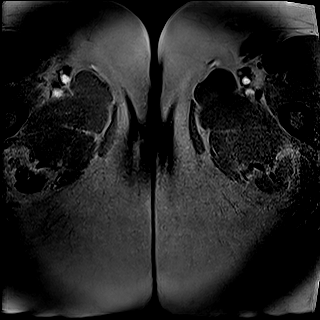
[im 24/72]
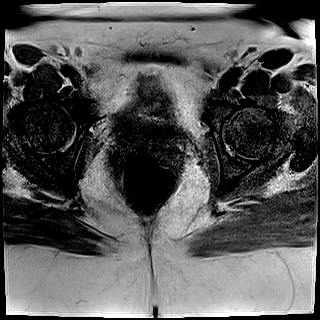
[im 48/72]
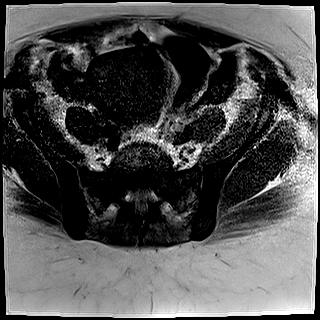
[im 72/72]
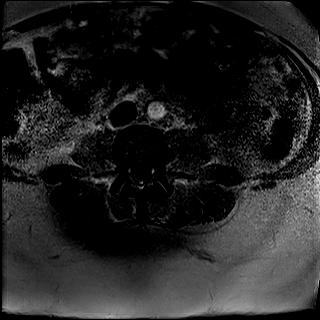

[Series 11: T1 dynamic · axial · 3.0mm · 0.84mm/px · z∈[-49,+188]mm · 4 of 80 slices shown (1 of 4)]
[im 1/80]
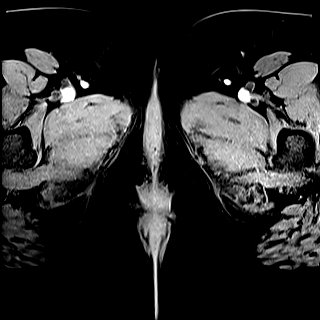
[im 27/80]
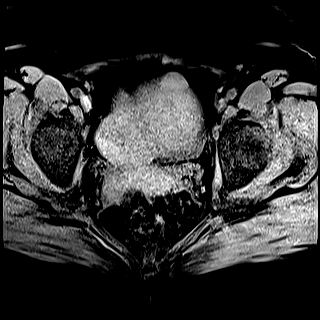
[im 53/80]
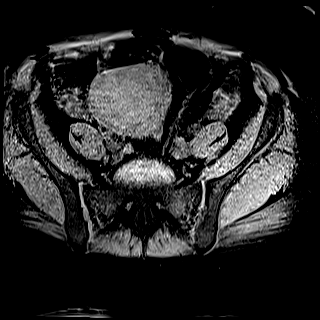
[im 80/80]
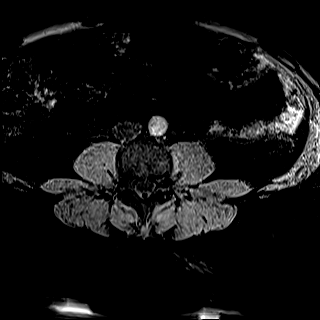

[Series 14: T1 dynamic · axial · 3.0mm · 0.84mm/px · z∈[-49,+188]mm · 4 of 80 slices shown (2 of 4)]
[im 1/80]
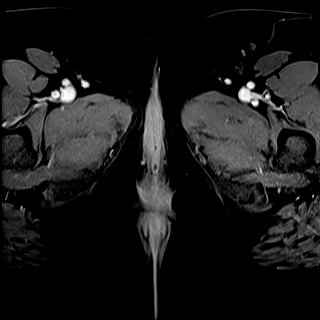
[im 27/80]
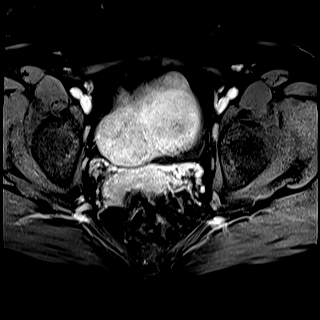
[im 53/80]
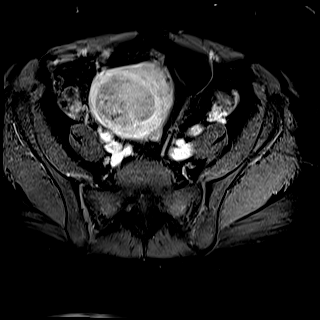
[im 80/80]
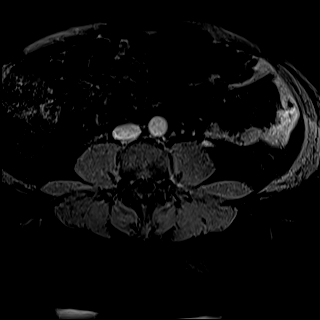

[Series 16: T1 dynamic · axial · 3.0mm · 0.84mm/px · z∈[-49,+188]mm · 4 of 80 slices shown (3 of 4)]
[im 1/80]
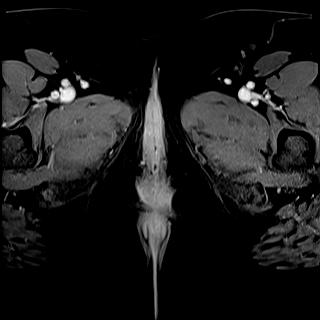
[im 27/80]
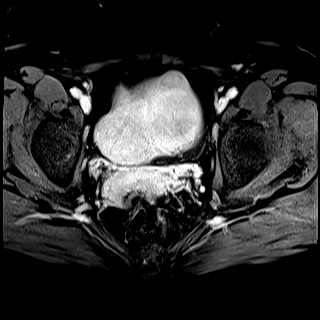
[im 53/80]
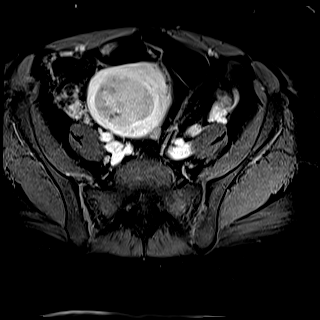
[im 80/80]
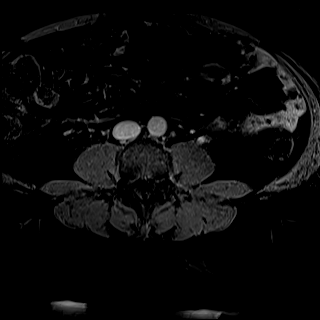

[Series 18: T1 dynamic · axial · 3.0mm · 0.84mm/px · z∈[-49,+188]mm · 4 of 80 slices shown (4 of 4)]
[im 1/80]
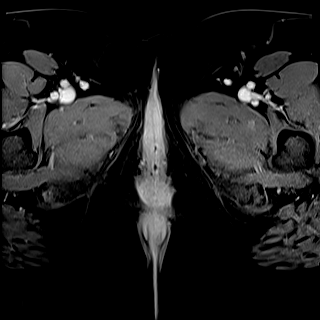
[im 27/80]
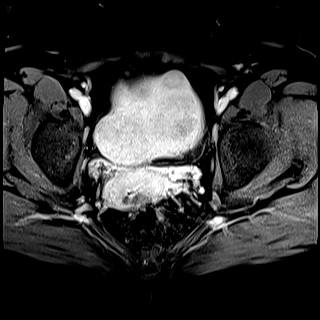
[im 53/80]
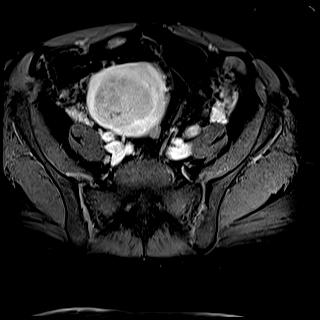
[im 80/80]
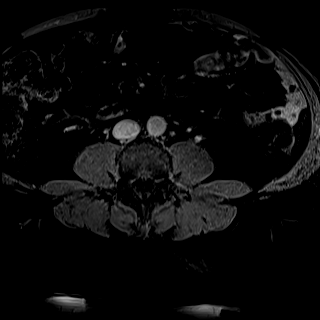

[Series 19: T1 fat-sat post-contrast · sagittal · 5.0mm · 0.55mm/px · 2 of 40 slices shown]
[im 1/40]
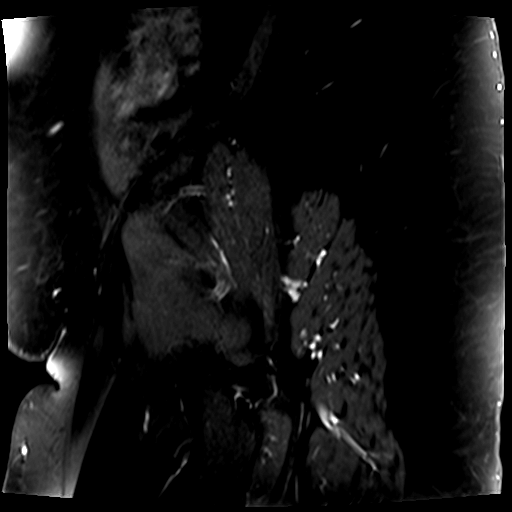
[im 40/40]
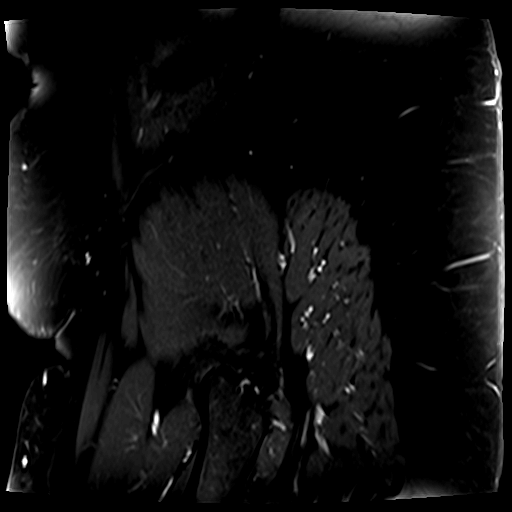

[34 of 48 positions shown; findings below may reference images not displayed]

FINDINGS: Lower Urinary Tract: No bladder or urethral abnormality identified.

Bowel:  Unremarkable visualized pelvic bowel loops.

Vascular/Lymphatic: No pathologically enlarged lymph nodes or other
significant abnormality.

Reproductive:

-- Uterus: Measures 14.9 x 8.8 x 9.6 cm (volume = 660 cm^3).
Numerous fibroids are seen which involve the uterus diffusely. These
range in size from less than 1 cm to 5.3 cm in maximum diameter.
These fibroids are mostly intramural and subserosal in location.

-- Intracavitary fibroids: 2 small intracavitary fibroids are seen
upper portion of the endometrial cavity which measure 1.6 cm and
cm in maximum diameter.

-- Pedunculated fibroids: None.

-- Fibroid contrast enhancement: All fibroids show contrast
enhancement, without significant degeneration/devascularization.

-- Right ovary:  Appears normal.  No mass identified.

-- Left ovary:  Appears normal.  No mass identified.

Other: No abnormal free fluid.

Musculoskeletal:  Unremarkable.
IMPRESSION: 1. Diffuse uterine involvement by fibroids, as described above. Two
small intracavitary fibroids noted in the superior portion of the
endometrial cavity. No pedunculated fibroids identified.
2. Normal appearance of both ovaries. No adnexal mass identified.

## 2020-10-28 ENCOUNTER — Inpatient Hospital Stay: Payer: 59

## 2020-10-28 ENCOUNTER — Inpatient Hospital Stay: Payer: 59 | Admitting: Oncology

## 2021-02-26 ENCOUNTER — Other Ambulatory Visit: Payer: 59 | Admitting: Internal Medicine

## 2021-02-26 ENCOUNTER — Other Ambulatory Visit: Payer: Self-pay

## 2021-02-26 DIAGNOSIS — Z0000 Encounter for general adult medical examination without abnormal findings: Secondary | ICD-10-CM

## 2021-02-26 DIAGNOSIS — Z13220 Encounter for screening for lipoid disorders: Secondary | ICD-10-CM

## 2021-02-26 DIAGNOSIS — Z1329 Encounter for screening for other suspected endocrine disorder: Secondary | ICD-10-CM | POA: Diagnosis not present

## 2021-02-27 ENCOUNTER — Ambulatory Visit (INDEPENDENT_AMBULATORY_CARE_PROVIDER_SITE_OTHER): Payer: 59 | Admitting: Internal Medicine

## 2021-02-27 VITALS — BP 136/70 | HR 77 | Temp 98.4°F | Ht 66.0 in | Wt 273.8 lb

## 2021-02-27 DIAGNOSIS — Z8639 Personal history of other endocrine, nutritional and metabolic disease: Secondary | ICD-10-CM | POA: Diagnosis not present

## 2021-02-27 DIAGNOSIS — Z0000 Encounter for general adult medical examination without abnormal findings: Secondary | ICD-10-CM

## 2021-02-27 DIAGNOSIS — Z1211 Encounter for screening for malignant neoplasm of colon: Secondary | ICD-10-CM | POA: Diagnosis not present

## 2021-02-27 LAB — CBC WITH DIFFERENTIAL/PLATELET
Absolute Monocytes: 408 cells/uL (ref 200–950)
Basophils Absolute: 77 cells/uL (ref 0–200)
Basophils Relative: 1 %
Eosinophils Absolute: 139 cells/uL (ref 15–500)
Eosinophils Relative: 1.8 %
HCT: 36.2 % (ref 35.0–45.0)
Hemoglobin: 11.6 g/dL — ABNORMAL LOW (ref 11.7–15.5)
Lymphs Abs: 1956 cells/uL (ref 850–3900)
MCH: 25.9 pg — ABNORMAL LOW (ref 27.0–33.0)
MCHC: 32 g/dL (ref 32.0–36.0)
MCV: 80.8 fL (ref 80.0–100.0)
MPV: 10.6 fL (ref 7.5–12.5)
Monocytes Relative: 5.3 %
Neutro Abs: 5121 cells/uL (ref 1500–7800)
Neutrophils Relative %: 66.5 %
Platelets: 301 10*3/uL (ref 140–400)
RBC: 4.48 10*6/uL (ref 3.80–5.10)
RDW: 14.2 % (ref 11.0–15.0)
Total Lymphocyte: 25.4 %
WBC: 7.7 10*3/uL (ref 3.8–10.8)

## 2021-02-27 LAB — LIPID PANEL
Cholesterol: 178 mg/dL (ref <200)
HDL: 60 mg/dL (ref >=50)
LDL Cholesterol (Calc): 104 mg/dL (calc) — ABNORMAL HIGH
Non-HDL Cholesterol (Calc): 118 mg/dL (calc) (ref <130)
Total CHOL/HDL Ratio: 3 (calc) (ref <5.0)
Triglycerides: 49 mg/dL (ref <150)

## 2021-02-27 LAB — COMPLETE METABOLIC PANEL WITH GFR
AG Ratio: 1.6 (calc) (ref 1.0–2.5)
ALT: 8 U/L (ref 6–29)
AST: 9 U/L — ABNORMAL LOW (ref 10–35)
Albumin: 4.1 g/dL (ref 3.6–5.1)
Alkaline phosphatase (APISO): 52 U/L (ref 31–125)
BUN: 12 mg/dL (ref 7–25)
CO2: 26 mmol/L (ref 20–32)
Calcium: 9 mg/dL (ref 8.6–10.2)
Chloride: 105 mmol/L (ref 98–110)
Creat: 0.67 mg/dL (ref 0.50–0.99)
Globulin: 2.5 g/dL (calc) (ref 1.9–3.7)
Glucose, Bld: 85 mg/dL (ref 65–99)
Potassium: 4.2 mmol/L (ref 3.5–5.3)
Sodium: 136 mmol/L (ref 135–146)
Total Bilirubin: 0.5 mg/dL (ref 0.2–1.2)
Total Protein: 6.6 g/dL (ref 6.1–8.1)
eGFR: 110 mL/min/{1.73_m2} (ref >=60)

## 2021-02-27 LAB — POCT URINALYSIS DIPSTICK
Bilirubin, UA: NEGATIVE
Blood, UA: NEGATIVE
Glucose, UA: NEGATIVE
Ketones, UA: NEGATIVE
Leukocytes, UA: NEGATIVE
Nitrite, UA: NEGATIVE
Protein, UA: NEGATIVE
Spec Grav, UA: 1.02 (ref 1.010–1.025)
Urobilinogen, UA: 0.2 E.U./dL
pH, UA: 6 (ref 5.0–8.0)

## 2021-02-27 LAB — TSH: TSH: 2.4 mIU/L

## 2021-02-27 NOTE — Progress Notes (Signed)
Subjective:    Patient ID: Lori Lara, female    DOB: October 29, 1975, 46 y.o.   MRN: [id number]  HPI Pleasant 46 year old Female seen for health maintenance exam and evaluation of medical issues.  She has a history of uterine artery embolization in August by Dr. Markus Daft, Radiologist and has follow-up with Dr. Donneta Romberg.  She had MR pelvis in March by Dr. Jiles Harold to follow-up on uterine artery embolization/uterine fibroids.  Noted was decreased uterine volume and size of individual fibroids.  No new enlarging uterine masses identified.  Persistent 1 cm submucosal or intracavitary fibroid noted in the uterine fundus unchanged.  Normal appearance of both ovaries.  Uterine fibroids led to menorrhagia and iron deficiency anemia.  Her hemoglobin has improved from 6.7 g in September 2021 to 11.6 g in January 2023.  Previously saw Dr. Beryle Beams in 2018 for iron deficiency anemia not responding to oral iron and received IV iron injections for time.  She was initially seen here for the first time in July 2018 with history of iron deficiency anemia.  History of vitamin D deficiency treated with high-dose vitamin D.  She has a history of constipation and internal hemorrhoids while on oral iron treatment.  History of C-section.  History of PID treated in 2011.  No known drug allergies.  Social history: She is employed as a Interior and spatial designer at [employer].  She has a high school education.  She is single.  Does not smoke or consume alcohol.  She has 2 adult sons.  Family history: Father in good health.  Mother with history of hypertension and rheumatoid arthritis.  1 sister in her mid 91s in good health.  Paternal grandmother with history of dementia.  Vitamin D level was last checked in September 2021 and was 32.  I am going to continue her high-dose vitamin D supplementation weekly.  I am pleased with her hemoglobin at 11.6 g.  Her MCV is now normal  at 80.8.  Her TSH is normal at 2.40.  She has mild elevation of LDL at 104.  Recommend diet and exercise for that.  Renal functions are normal.    Review of Systems mammogram through GYN. Last one on file in Epic May 2021.  Recommend this annually.     Objective:   Physical Exam Blood pressure 136/70 pulse 77 temperature 98.4 degrees weight 273 pounds BMI 44.18  Skin: Warm and dry.  No cervical adenopathy or thyromegaly.  TMs clear.  Neck supple.  No thyromegaly.  Chest clear to auscultation.  Cardiac exam: Regular rate and rhythm without ectopy.  Abdomen is soft nondistended without hepatosplenomegaly masses or tenderness.  No pitting edema of the lower extremities.  Neurological exam is intact without focal deficits.       Assessment & Plan:  BMI 44.18-she would be a candidate for gastric bypass surgery if she so desires.  Referral can be made to [facility] if she is interested.  The alternative would be [facility] for weight loss management.  History of iron deficiency anemia-corrected with uterine artery embolization for fibroids which were causing menorrhagia.  Marked improvement in hemoglobin and MCV.  She will continue multivitamin with iron.  History of vitamin D deficiency-would like to continue high-dose vitamin D supplementation weekly and prescription has been sent.  Health maintenance-needs colonoscopy.  Referral will be made to [facility].  Asked her to check with employee health about status of her tetanus  immunization.  Plan: Continue to see Dr. Garwin Brothers for GYN care.  Needs annual mammogram.  I do not see a recent one in Epic.  Return in 1 year or as needed.

## 2021-02-27 NOTE — Patient Instructions (Addendum)
It was a pleasure to see you today.  Continue multivitamin with iron.  Your iron deficiency anemia has improved a great deal.  We will place referral for  screening colonoscopy. Info given on Dr. Migdalia Dk clinic for weight loss. Check with Employee health about tetanus update.

## 2021-03-30 ENCOUNTER — Encounter: Payer: Self-pay | Admitting: Internal Medicine

## 2021-04-04 MED ORDER — ERGOCALCIFEROL 1.25 MG (50000 UT) PO CAPS
50000.0000 [IU] | ORAL_CAPSULE | ORAL | 3 refills | Status: DC
  Filled 2021-04-04 – 2021-09-02 (×2): qty 12, 84d supply, fill #0
  Filled 2022-01-21: qty 12, 84d supply, fill #1

## 2021-04-06 ENCOUNTER — Other Ambulatory Visit (HOSPITAL_COMMUNITY): Payer: Self-pay

## 2021-04-14 ENCOUNTER — Other Ambulatory Visit (HOSPITAL_COMMUNITY): Payer: Self-pay

## 2021-08-06 DIAGNOSIS — Z1231 Encounter for screening mammogram for malignant neoplasm of breast: Secondary | ICD-10-CM | POA: Diagnosis not present

## 2021-08-18 DIAGNOSIS — Z01419 Encounter for gynecological examination (general) (routine) without abnormal findings: Secondary | ICD-10-CM | POA: Diagnosis not present

## 2021-09-02 ENCOUNTER — Other Ambulatory Visit (HOSPITAL_COMMUNITY): Payer: Self-pay

## 2021-09-28 DIAGNOSIS — H5213 Myopia, bilateral: Secondary | ICD-10-CM | POA: Diagnosis not present

## 2021-09-28 DIAGNOSIS — H524 Presbyopia: Secondary | ICD-10-CM | POA: Diagnosis not present

## 2022-01-21 ENCOUNTER — Encounter: Payer: Self-pay | Admitting: Oncology

## 2022-01-23 ENCOUNTER — Encounter: Payer: Self-pay | Admitting: Oncology

## 2022-02-02 ENCOUNTER — Other Ambulatory Visit (HOSPITAL_COMMUNITY): Payer: Self-pay

## 2022-02-25 ENCOUNTER — Other Ambulatory Visit: Payer: 59

## 2022-02-26 ENCOUNTER — Other Ambulatory Visit: Payer: 59

## 2022-03-01 ENCOUNTER — Encounter: Payer: Self-pay | Admitting: Oncology

## 2022-03-01 ENCOUNTER — Other Ambulatory Visit: Payer: Commercial Managed Care - PPO

## 2022-03-01 DIAGNOSIS — Z0000 Encounter for general adult medical examination without abnormal findings: Secondary | ICD-10-CM

## 2022-03-01 DIAGNOSIS — Z13220 Encounter for screening for lipoid disorders: Secondary | ICD-10-CM

## 2022-03-01 DIAGNOSIS — Z136 Encounter for screening for cardiovascular disorders: Secondary | ICD-10-CM | POA: Diagnosis not present

## 2022-03-01 DIAGNOSIS — Z1329 Encounter for screening for other suspected endocrine disorder: Secondary | ICD-10-CM

## 2022-03-02 LAB — CBC WITH DIFFERENTIAL/PLATELET
Absolute Monocytes: 348 cells/uL (ref 200–950)
Basophils Absolute: 67 cells/uL (ref 0–200)
Basophils Relative: 1 %
Eosinophils Absolute: 141 cells/uL (ref 15–500)
Eosinophils Relative: 2.1 %
HCT: 37.8 % (ref 35.0–45.0)
Hemoglobin: 12.3 g/dL (ref 11.7–15.5)
Lymphs Abs: 1735 cells/uL (ref 850–3900)
MCH: 27.1 pg (ref 27.0–33.0)
MCHC: 32.5 g/dL (ref 32.0–36.0)
MCV: 83.3 fL (ref 80.0–100.0)
MPV: 10.5 fL (ref 7.5–12.5)
Monocytes Relative: 5.2 %
Neutro Abs: 4409 cells/uL (ref 1500–7800)
Neutrophils Relative %: 65.8 %
Platelets: 308 10*3/uL (ref 140–400)
RBC: 4.54 10*6/uL (ref 3.80–5.10)
RDW: 14.3 % (ref 11.0–15.0)
Total Lymphocyte: 25.9 %
WBC: 6.7 10*3/uL (ref 3.8–10.8)

## 2022-03-02 LAB — LIPID PANEL
Cholesterol: 165 mg/dL (ref <200)
HDL: 57 mg/dL (ref >=50)
LDL Cholesterol (Calc): 95 mg/dL (calc)
Non-HDL Cholesterol (Calc): 108 mg/dL (calc) (ref <130)
Total CHOL/HDL Ratio: 2.9 (calc) (ref <5.0)
Triglycerides: 50 mg/dL (ref <150)

## 2022-03-02 LAB — COMPLETE METABOLIC PANEL WITH GFR
AG Ratio: 1.8 (calc) (ref 1.0–2.5)
ALT: 10 U/L (ref 6–29)
AST: 10 U/L (ref 10–35)
Albumin: 4.2 g/dL (ref 3.6–5.1)
Alkaline phosphatase (APISO): 52 U/L (ref 31–125)
BUN: 11 mg/dL (ref 7–25)
CO2: 27 mmol/L (ref 20–32)
Calcium: 9.1 mg/dL (ref 8.6–10.2)
Chloride: 108 mmol/L (ref 98–110)
Creat: 0.69 mg/dL (ref 0.50–0.99)
Globulin: 2.4 g/dL (calc) (ref 1.9–3.7)
Glucose, Bld: 84 mg/dL (ref 65–99)
Potassium: 4.5 mmol/L (ref 3.5–5.3)
Sodium: 141 mmol/L (ref 135–146)
Total Bilirubin: 0.5 mg/dL (ref 0.2–1.2)
Total Protein: 6.6 g/dL (ref 6.1–8.1)
eGFR: 108 mL/min/{1.73_m2} (ref >=60)

## 2022-03-02 LAB — TSH: TSH: 2.11 mIU/L

## 2022-03-04 NOTE — Progress Notes (Shared)
Subjective:    Patient ID: Lori Lara , female    DOB: 1975-03-07, 47 y.o.    MRN: [id number]   47 y.o. Female presents today for a comprehensive physical exam. History of anemia and Vitamin D deficiency.   She has a history of uterine artery embolization/uterine fibroids that led to menorrhagia and iron deficiency anemia. However, she reports that she no longer has menorrhagia, shortness of breath or palpitations. She has a history of vitamin D deficiency treated with Drisdol 1.25 mg once weekly and is requesting a refill. Her hemoglobin has improved from 11.6 on 02/26/21 to 12.3 on 03/01/22. Iron level last measured at 32 on 06/26/20.   She is overdue for her Pap smear. Last Pap completed on 04/07/2018. Advised to repeat in 3 years. She sees her gynecologist, Dr. Servando Salina, in June 2024.  She has not completed her first colonoscopy yet. She plans to schedule her colonoscopy and is interested in a referral for Dr. Silverio Decamp.  She is overdue for a mammogram. Last mammogram was completed on 08/05/2020 with no mammographic evidence of malignancy. Advised to repeat in 1 year. She plans to schedule a mammogram with [facility].  Thyroid function normal with TSH at 2.11 on 03/01/22. LDL normal at 95 on 03/01/22. Liver function normal with last ALT at 10, AST at 10, ALKPHOS at 50, BILITOT at 0.5 on 03/01/22.  She reports drinking only one cup of coffee daily.  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  She has a high school education and is single.  She does not smoke or consume alcohol.  She has 2 sons, ages 27 and 25.       Family history: Father in good health.  Mother with history of hypertension and rheumatoid arthritis.  1 sister in her mid 57s in good health.  Maternal grandfather with history of pneumonia.  Paternal grandmother with history of dementia.        Patient Care Team: Elby Showers, MD as PCP - General (Internal Medicine)   Review of Systems  Constitutional:  Negative for chills, fever, malaise/fatigue and weight loss.  HENT:  Negative for hearing loss, sinus pain and sore throat.   Respiratory:  Negative for cough and hemoptysis.   Cardiovascular:  Negative for chest pain, palpitations, leg swelling and PND.  Gastrointestinal:  Negative for abdominal pain, constipation, diarrhea, heartburn, nausea and vomiting.  Genitourinary:  Negative for dysuria, frequency and urgency.  Musculoskeletal:  Negative for back pain, myalgias and neck pain.  Skin:  Negative for itching and rash.  Neurological:  Negative for dizziness, tingling, seizures and headaches.  Endo/Heme/Allergies:  Negative for polydipsia.  Psychiatric/Behavioral:  Negative for depression. The patient is not nervous/anxious.         Objective:   Vitals: BP 112/70   Pulse 78   Temp 98.6 F (37  C) (Tympanic)   Ht '5\' 6"'$  (1.676 m)   Wt 273 lb 12.8 oz (124.2 kg)   LMP 02/25/2022   SpO2 99%   BMI 44.19 kg/m    Physical Exam Vitals and nursing note reviewed. Exam conducted with a chaperone present.  Constitutional:      General: She is not in acute distress.    Appearance: Normal appearance. She is not ill-appearing or toxic-appearing.  HENT:     Head: Normocephalic and atraumatic.     Right Ear: Hearing, tympanic membrane, ear canal and external ear normal.     Left Ear: Hearing, tympanic membrane, ear canal and external ear normal.     Mouth/Throat:     Mouth: Mucous  membranes are moist.     Pharynx: Oropharynx is clear.  Eyes:     Extraocular Movements: Extraocular movements intact.     Pupils: Pupils are equal, round, and reactive to light.  Neck:     Thyroid: No thyroid mass, thyromegaly or thyroid tenderness.     Vascular: No carotid bruit.  Cardiovascular:     Rate and Rhythm: Normal rate and regular rhythm. No extrasystoles are present.    Pulses: Normal pulses.          Dorsalis pedis pulses are 2+ on the right side and 2+ on the left side.       Posterior tibial pulses are 2+ on the right side and 2+ on the left side.     Heart sounds: Normal heart sounds. No murmur heard.    No friction rub. No gallop.     Comments: Trace edema in bilateral LE. Pulmonary:     Effort: Pulmonary effort is normal.     Breath sounds: Normal breath sounds. No decreased breath sounds, wheezing, rhonchi or rales.  Chest:     Chest wall: No mass.  Abdominal:     Palpations: Abdomen is soft. There is no hepatomegaly, splenomegaly or mass.     Tenderness: There is no abdominal tenderness.     Hernia: No hernia is present.  Musculoskeletal:     Cervical back: Normal range of motion.     Right lower leg: Edema present.     Left lower leg: Edema present.  Lymphadenopathy:     Cervical: No cervical adenopathy.     Upper Body:     Right upper body: No supraclavicular adenopathy.     Left upper body: No supraclavicular adenopathy.  Skin:    General: Skin is warm and dry.  Neurological:     General: No focal deficit present.     Mental Status: She is alert and oriented to person, place, and time. Mental status is at baseline.     Sensory: Sensation is intact.     Motor: Motor function is intact. No weakness.     Deep Tendon Reflexes: Reflexes are normal and symmetric.  Psychiatric:        Attention and Perception: Attention normal.        Mood and Affect: Mood normal.        Speech: Speech normal.        Behavior: Behavior normal.        Thought Content:  Thought content normal.        Cognition and Memory: Cognition normal.        Judgment: Judgment normal.          Assessment & Plan:   Pap smear: Last Pap completed on 04/07/2018. Advised to repeat in  3 years. She sees her gynecologist, Dr. Servando Salina, in June 2024.  Colonoscopy: She has not completed her first colonoscopy yet. She plans to schedule her colonoscopy and is interested in a referral for Dr. Silverio Decamp.  Mammogram: Last mammogram was completed on 08/05/2020 with no mammographic evidence of malignancy. Advised to repeat in 1 year. She plans to schedule a mammogram with [facility].  Health Maintenance: Her hemoglobin has improved from 11.6 on 02/26/21 to 12.3 on 03/01/22. She will work on losing weight.  Vaccine Counseling: Follow up with employee health for tetanus update. UTD on flu vaccine.   I,Alexander Ruley,acting as a Education administrator for Elby Showers, MD.,have documented all relevant documentation on the behalf of Elby Showers, MD,as directed by  Elby Showers, MD while in the presence of Elby Showers, MD. I, Elby Showers, MD, have reviewed all documentation for this visit. The documentation on 03/10/22 for the exam, diagnosis, procedures, and orders are all accurate and complete.

## 2022-03-05 ENCOUNTER — Ambulatory Visit (INDEPENDENT_AMBULATORY_CARE_PROVIDER_SITE_OTHER): Payer: Commercial Managed Care - PPO | Admitting: Internal Medicine

## 2022-03-05 ENCOUNTER — Other Ambulatory Visit (HOSPITAL_COMMUNITY): Payer: Self-pay

## 2022-03-05 ENCOUNTER — Encounter: Payer: Self-pay | Admitting: Internal Medicine

## 2022-03-05 VITALS — BP 112/70 | HR 78 | Temp 98.6°F | Ht 66.0 in | Wt 273.8 lb

## 2022-03-05 DIAGNOSIS — Z8639 Personal history of other endocrine, nutritional and metabolic disease: Secondary | ICD-10-CM

## 2022-03-05 DIAGNOSIS — Z0000 Encounter for general adult medical examination without abnormal findings: Secondary | ICD-10-CM | POA: Diagnosis not present

## 2022-03-05 LAB — POCT URINALYSIS DIPSTICK
Bilirubin, UA: NEGATIVE
Blood, UA: NEGATIVE
Glucose, UA: NEGATIVE
Ketones, UA: NEGATIVE
Leukocytes, UA: NEGATIVE
Nitrite, UA: NEGATIVE
Protein, UA: NEGATIVE
Spec Grav, UA: 1.01 (ref 1.010–1.025)
Urobilinogen, UA: 0.2 E.U./dL
pH, UA: 5 (ref 5.0–8.0)

## 2022-03-05 MED ORDER — ERGOCALCIFEROL 1.25 MG (50000 UT) PO CAPS
50000.0000 [IU] | ORAL_CAPSULE | ORAL | 3 refills | Status: DC
  Filled 2022-03-05: qty 12, 84d supply, fill #0

## 2022-03-17 ENCOUNTER — Other Ambulatory Visit (HOSPITAL_COMMUNITY): Payer: Self-pay

## 2022-07-21 ENCOUNTER — Encounter: Payer: Self-pay | Admitting: Internal Medicine

## 2022-07-22 ENCOUNTER — Encounter: Payer: Self-pay | Admitting: Internal Medicine

## 2022-07-22 ENCOUNTER — Other Ambulatory Visit (HOSPITAL_COMMUNITY): Payer: Self-pay

## 2022-07-22 ENCOUNTER — Ambulatory Visit: Payer: Commercial Managed Care - PPO | Admitting: Internal Medicine

## 2022-07-22 ENCOUNTER — Encounter: Payer: Self-pay | Admitting: Oncology

## 2022-07-22 VITALS — BP 122/76 | HR 71 | Temp 97.4°F | Resp 16 | Ht 66.0 in | Wt 274.4 lb

## 2022-07-22 DIAGNOSIS — L568 Other specified acute skin changes due to ultraviolet radiation: Secondary | ICD-10-CM | POA: Diagnosis not present

## 2022-07-22 MED ORDER — HYDROCORTISONE 2.5 % EX CREA
TOPICAL_CREAM | Freq: Two times a day (BID) | CUTANEOUS | 0 refills | Status: AC
  Filled 2022-07-22: qty 30, 15d supply, fill #0

## 2022-07-22 MED ORDER — METHYLPREDNISOLONE ACETATE 80 MG/ML IJ SUSP
80.0000 mg | Freq: Once | INTRAMUSCULAR | Status: AC
  Administered 2022-07-22: 80 mg via INTRAMUSCULAR

## 2022-07-22 NOTE — Progress Notes (Addendum)
Patient Care Team: Margaree Mackintosh, MD as PCP - General (Internal Medicine)  Visit Date: 07/22/22  Subjective:    Patient ID: Lori Lara , Female   DOB: 1975/03/10, 47 y.o.    MRN: [id number]   47 y.o. Female presents today for itching rash on her face. She does not use makeup. Spends time outside regularly. Uses Vitamin E oil on face.  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  She has a high school education and is single.  She does not smoke or consume alcohol.  She has 2 sons, ages 26 and 58.       Family history: Father in good health.  Mother with history of hypertension and rheumatoid arthritis.  1 sister in her mid 43s in good health.  Maternal grandfather with history of pneumonia.  Paternal grandmother with history of dementia.          Review of Systems  Constitutional:  Negative for fever and malaise/fatigue.  HENT:  Negative for congestion.   Eyes:  Negative for blurred vision.  Respiratory:  Negative for cough and shortness of breath.   Cardiovascular:  Negative for chest pain, palpitations and leg swelling.  Gastrointestinal:  Negative for vomiting.  Musculoskeletal:  Negative for back pain.  Skin:  Positive for itching and rash.  Neurological:  Negative for loss of consciousness and headaches.        Objective:   Vitals: BP 122/76   Pulse 71   Temp (!) 97.4 F (36.3 C) (Temporal)   Resp 16   Ht 5\' 6"  (1.676 m)   Wt 274 lb 6.4 oz (124.5 kg)   SpO2 97%   BMI 44.29 kg/m     Physical Exam Vitals and nursing note reviewed.  Constitutional:      General: She is not in acute distress.    Appearance: Normal appearance. She is not toxic-appearing.  HENT:     Head: Normocephalic and atraumatic.  Pulmonary:     Effort: Pulmonary effort is normal.  Skin:    General: Skin is warm and dry.     Comments: Fine papular rash behind left ear. Red papule below left eye with macular erythema surrounding.  Neurological:     Mental Status: She is alert and oriented to person, place, and time. Mental status is at baseline.  Psychiatric:        Mood and Affect: Mood normal.        Behavior: Behavior normal.        Thought Content: Thought content normal.        Judgment: Judgment normal.       Results:   Studies obtained and personally reviewed by me:   Labs:       Component Value Date/Time   NA 141 03/01/2022 0938   K 4.5 03/01/2022 0938   CL 108 03/01/2022 0938   CO2 27 03/01/2022 0938   GLUCOSE  84 03/01/2022 0938   BUN 11 03/01/2022 0938   CREATININE 0.69 03/01/2022 0938   CALCIUM 9.1 03/01/2022 0938   PROT 6.6 03/01/2022 0938   ALBUMIN 3.7 02/23/2019 0828   AST 10 03/01/2022 0938   AST 14 (L) 02/23/2019 0828   ALT 10 03/01/2022 0938   ALT 11 02/23/2019 0828   ALKPHOS 50 02/23/2019 0828   BILITOT 0.5 03/01/2022 0938   BILITOT 0.5 02/23/2019 0828   GFRNONAA 106 10/18/2019 0943   GFRAA 123 10/18/2019 0943     Lab Results  Component Value Date   WBC 6.7 03/01/2022   HGB 12.3 03/01/2022   HCT 37.8 03/01/2022   MCV 83.3 03/01/2022   PLT 308 03/01/2022    Lab Results  Component Value Date   CHOL 165 03/01/2022   HDL 57 03/01/2022   LDLCALC 95 03/01/2022   TRIG 50 03/01/2022   CHOLHDL 2.9 03/01/2022    No results found for: "HGBA1C"   Lab Results  Component Value Date   TSH 2.11 03/01/2022      Assessment & Plan:   Macular papular rash face and neck: prescribed hydrocortisone 2.5% cream use twice daily. Administered Depo 80  mg IM. Referral for Dermatology.   Contact dermatitis or photosensitivity dermatitis  are in the differential dx.  I,Alexander Ruley,acting as a Neurosurgeon for Margaree Mackintosh, MD.,have documented all relevant documentation on the behalf of Margaree Mackintosh, MD,as directed by  Margaree Mackintosh, MD while in the presence of Margaree Mackintosh, MD.   I, Margaree Mackintosh, MD, have reviewed all documentation for this visit. The documentation on 07/28/22 for the exam, diagnosis, procedures, and orders are all accurate and complete.

## 2022-07-28 NOTE — Patient Instructions (Signed)
I believe he likely had either a photosensitivity dermatitis or a contact dermatitis.  I have prescribed hydrocortisone 2.5% cream to use twice daily until rash is resolved.  Given Depo-Medrol 80 mg IM.  May need to see Dermatology if not improving.  Patient has expressed desire to see Dermatologist

## 2022-08-05 ENCOUNTER — Other Ambulatory Visit (HOSPITAL_COMMUNITY): Payer: Self-pay

## 2022-10-01 DIAGNOSIS — Z1231 Encounter for screening mammogram for malignant neoplasm of breast: Secondary | ICD-10-CM | POA: Diagnosis not present

## 2022-10-01 LAB — HM MAMMOGRAPHY

## 2022-10-18 ENCOUNTER — Ambulatory Visit (INDEPENDENT_AMBULATORY_CARE_PROVIDER_SITE_OTHER): Payer: Commercial Managed Care - PPO | Admitting: Dermatology

## 2022-10-18 VITALS — BP 141/95 | HR 87

## 2022-10-18 DIAGNOSIS — L564 Polymorphous light eruption: Secondary | ICD-10-CM

## 2022-10-18 DIAGNOSIS — L668 Other cicatricial alopecia: Secondary | ICD-10-CM | POA: Diagnosis not present

## 2022-10-18 MED ORDER — SAFETY SEAL MISCELLANEOUS MISC: 1.0000 | Freq: Every morning | 3 refills | Status: DC

## 2022-10-18 NOTE — Patient Instructions (Signed)
Hello Ms. Madilyn Fireman,  Thank you for visiting [place] today. We appreciate your commitment to improving your health. Here is a summary of the key instructions and recommendations from today's consultation:  - Sun Exposure and Skin Care:   - Avoid direct sun exposure and wear protective clothing.   - Use mineral-based sunscreen with zinc oxide or titanium dioxide for direct sunlight exposure.   - Apply Eucerin Advanced Repair and CeraVe as recommended; samples provided.   - For flare-ups, apply a steroid cream for up to two weeks to manage inflammation without causing skin thinning.  - Hair Care and Treatment:   - Continue with non-tense protective hairstyles like twists.   - Use a compounded medication combining clobetasol and minoxidil, applied in a thin layer in the morning. This will be sent to [facility] and costs about $40 per bottle.   - Consider taking Viviscal supplements (two tablets a day, approximately $45 per month) and adding collagen (e.g., Vita Proteins) to your diet for improved hair thickness and growth.  - Monitoring and Follow-Up:   - We took photographs of your scalp today to track the progress of your treatment. We will compare these during your follow-up visits every three months.  Please remember to pick up and use your prescribed medications as directed and follow the lifestyle and skincare advice closely. If you have any questions or need further assistance, feel free to contact us via MyChart for the fastest response.  Warm regards,  Dr. [facility]

## 2022-10-18 NOTE — Progress Notes (Signed)
New Patient Visit   Subjective  Lori Lara is a 47 y.o. female who presents for the following: New patient here concerning hair loss that gradually seems to be getting worst over 5 years. Has been dealing with some stress. Does not currently using any treatments.   Also would like to discuss history of itchy rash when exposed to prolonged time in sun. Was seen in June by primary provider and given depo injection in office and prescribed hydrocortisone cream to use as needed. Patient currently no longer flared.    The patient has spots, moles and lesions to be evaluated, some may be new or changing and the patient may have concern these could be cancer.   The following portions of the chart were reviewed this encounter and updated as appropriate: medications, allergies, medical history  Review of Systems:  No other skin or systemic complaints except as noted in HPI or Assessment and Plan.  Objective  Well appearing patient in no apparent distress; mood and affect are within normal limits.  A focused examination was performed of the following areas: Scalp   Relevant exam findings are noted in the Assessment and Plan.  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 No current rash present.    - Plan: Advise patient to wear protective clothing and use mineral-based sunscreens with zinc oxide or titanium dioxide (e.g., Eucerin Advanced Repair, CeraVe mineral and chemical mix). In case of flares, use steroid cream for up to two weeks without risk of skin thinning.  2. Central Centrifugal Cicatricial Alopecia (CCCA)    - Assessment: Patient exhibits significant thinning but not complete hair loss, consistent with CCCA. Likely triggered by hair  care practices and genetic predisposition.    - Plan:        a. Prescribe compounded clobetasol and minoxidil medication from [facility], apply a thin layer in the morning.        b. Consider oral metformin for future treatment after reviewing recent study on its effectiveness in reducing inflammatory markers in CCCA.        c. Recommend Viviscal supplement (two tablets a day) for hair growth support.        d. Suggest adding collagen (e.g., Vital Proteins) to daily beverages for hair thickness and growth improvement.        - Monitor progress with photographs taken every three months.          Central centrifugal scarring alopecia Scalp   - Hair Care and Treatment:   - Continue with non-tense protective hairstyles like twists.   - Use a compounded medication combining clobetasol and minoxidil, applied in a thin layer in the morning. This will be sent to [facility] and costs about $40 per bottle.   - Consider taking Viviscal supplements (two tablets a day, approximately $45 per month) and adding collagen (e.g., Vita Proteins) to your diet for improved hair thickness and growth.  - Monitoring and Follow-Up:   - We took photographs of your scalp today to track the progress of your treatment. We will compare these during your follow-up visits every three months.   Safety Seal Miscellaneous MISC - Scalp Apply 1 Application topically in the morning. Medication name:  aa gel        Assessment & Plan   Polymorphic light disruption  Exam: no active lesions on exam  Treatment Plan:  - Sun Exposure and Skin Care:   - Avoid direct sun exposure and wear protective clothing.   - Use mineral-based sunscreen with zinc oxide or titanium dioxide for direct sunlight exposure.   - Apply Eucerin Advanced Repair and CeraVe as recommended; samples provided.   - For flare-ups, apply a steroid cream for up to two weeks to manage inflammation without causing skin  thinning.    Return for 3 month alopecia follow up.  I, Asher Muir, CMA, am acting as scribe for [employer], DO.   Documentation: I have reviewed the above documentation for accuracy and completeness, and I agree with the above.  Langston Reusing, DO

## 2022-10-27 ENCOUNTER — Encounter: Payer: Self-pay | Admitting: Dermatology

## 2023-01-20 ENCOUNTER — Ambulatory Visit: Payer: Commercial Managed Care - PPO | Admitting: Dermatology

## 2023-03-07 ENCOUNTER — Other Ambulatory Visit: Payer: Commercial Managed Care - PPO

## 2023-03-07 DIAGNOSIS — Z0000 Encounter for general adult medical examination without abnormal findings: Secondary | ICD-10-CM | POA: Diagnosis not present

## 2023-03-07 DIAGNOSIS — Z13220 Encounter for screening for lipoid disorders: Secondary | ICD-10-CM | POA: Diagnosis not present

## 2023-03-07 DIAGNOSIS — Z1329 Encounter for screening for other suspected endocrine disorder: Secondary | ICD-10-CM

## 2023-03-08 LAB — COMPLETE METABOLIC PANEL WITH GFR
AG Ratio: 2.2 (calc) (ref 1.0–2.5)
ALT: 9 U/L (ref 6–29)
AST: 12 U/L (ref 10–35)
Albumin: 4.4 g/dL (ref 3.6–5.1)
Alkaline phosphatase (APISO): 49 U/L (ref 31–125)
BUN: 11 mg/dL (ref 7–25)
CO2: 25 mmol/L (ref 20–32)
Calcium: 9.2 mg/dL (ref 8.6–10.2)
Chloride: 105 mmol/L (ref 98–110)
Creat: 0.71 mg/dL (ref 0.50–0.99)
Globulin: 2 g/dL (ref 1.9–3.7)
Glucose, Bld: 83 mg/dL (ref 65–99)
Potassium: 4.4 mmol/L (ref 3.5–5.3)
Sodium: 137 mmol/L (ref 135–146)
Total Bilirubin: 0.5 mg/dL (ref 0.2–1.2)
Total Protein: 6.4 g/dL (ref 6.1–8.1)
eGFR: 105 mL/min/{1.73_m2} (ref >=60)

## 2023-03-08 LAB — CBC WITH DIFFERENTIAL/PLATELET
Absolute Lymphocytes: 1685 {cells}/uL (ref 850–3900)
Absolute Monocytes: 382 {cells}/uL (ref 200–950)
Basophils Absolute: 58 {cells}/uL (ref 0–200)
Basophils Relative: 0.8 %
Eosinophils Absolute: 137 {cells}/uL (ref 15–500)
Eosinophils Relative: 1.9 %
HCT: 40.1 % (ref 35.0–45.0)
Hemoglobin: 13.2 g/dL (ref 11.7–15.5)
MCH: 28.6 pg (ref 27.0–33.0)
MCHC: 32.9 g/dL (ref 32.0–36.0)
MCV: 86.8 fL (ref 80.0–100.0)
MPV: 10.1 fL (ref 7.5–12.5)
Monocytes Relative: 5.3 %
Neutro Abs: 4939 {cells}/uL (ref 1500–7800)
Neutrophils Relative %: 68.6 %
Platelets: 305 10*3/uL (ref 140–400)
RBC: 4.62 10*6/uL (ref 3.80–5.10)
RDW: 13.4 % (ref 11.0–15.0)
Total Lymphocyte: 23.4 %
WBC: 7.2 10*3/uL (ref 3.8–10.8)

## 2023-03-08 LAB — LIPID PANEL
Cholesterol: 176 mg/dL (ref <200)
HDL: 64 mg/dL (ref >=50)
LDL Cholesterol (Calc): 98 mg/dL
Non-HDL Cholesterol (Calc): 112 mg/dL (ref <130)
Total CHOL/HDL Ratio: 2.8 (calc) (ref <5.0)
Triglycerides: 55 mg/dL (ref <150)

## 2023-03-08 LAB — TSH: TSH: 1.94 m[IU]/L

## 2023-03-11 ENCOUNTER — Encounter: Payer: Self-pay | Admitting: Internal Medicine

## 2023-03-11 ENCOUNTER — Ambulatory Visit (INDEPENDENT_AMBULATORY_CARE_PROVIDER_SITE_OTHER): Payer: Commercial Managed Care - PPO | Admitting: Internal Medicine

## 2023-03-11 ENCOUNTER — Encounter: Payer: Self-pay | Admitting: Oncology

## 2023-03-11 ENCOUNTER — Other Ambulatory Visit (HOSPITAL_COMMUNITY): Payer: Self-pay

## 2023-03-11 VITALS — BP 108/70 | HR 67 | Ht 66.0 in | Wt 275.0 lb

## 2023-03-11 DIAGNOSIS — Z0000 Encounter for general adult medical examination without abnormal findings: Secondary | ICD-10-CM | POA: Diagnosis not present

## 2023-03-11 DIAGNOSIS — Z8639 Personal history of other endocrine, nutritional and metabolic disease: Secondary | ICD-10-CM | POA: Diagnosis not present

## 2023-03-11 LAB — POCT URINALYSIS DIP (CLINITEK)
Bilirubin, UA: NEGATIVE
Blood, UA: NEGATIVE
Glucose, UA: NEGATIVE mg/dL
Ketones, POC UA: NEGATIVE mg/dL
Leukocytes, UA: NEGATIVE
Nitrite, UA: NEGATIVE
POC PROTEIN,UA: NEGATIVE
Spec Grav, UA: 1.01 (ref 1.010–1.025)
Urobilinogen, UA: 0.2 U/dL
pH, UA: 6 (ref 5.0–8.0)

## 2023-03-11 MED ORDER — ERGOCALCIFEROL 1.25 MG (50000 UT) PO CAPS
50000.0000 [IU] | ORAL_CAPSULE | ORAL | 3 refills | Status: DC
  Filled 2023-03-11 – 2023-08-28 (×2): qty 12, 84d supply, fill #0
  Filled 2023-11-28: qty 12, 84d supply, fill #1

## 2023-03-11 NOTE — Progress Notes (Signed)
Annual Wellness Visit   Patient Care Team: Margaree Mackintosh, MD as PCP - General (Internal Medicine)  Visit Date: 03/11/23   Chief Complaint  Patient presents with   Annual Exam   Subjective:  Patient: Lori Lara, Female DOB: 06-Jan-1976, 48 y.o. MRN: [id number]  Lori Lara is a 48 y.o. Female who presents today for her Annual Wellness Visit. Patient has history of Vitamin D deficiency.  Currently taking high-dose vitamin D weekly.  Vitamin D level was not checked with this exam due to expense.  She will continue with high-dose vitamin D weekly.  Notes recent episodes of palpitations for 5-10 mins.  No chest pain or shortness of breath.  Also has noticed some/mild pitting/ sock lines when removing socks-likely due to dependent edema.   History of Vitamin D Deficiency treated with Drisdol 1.25 mg once weekly. Has been taking it for the last few months.    Labs 03/07/23 CBC: WNL CMP: WNL Lipid Panel: WNL TSH: 1.94  Pap Smear July 2024 per pt with her gynecologist, Dr. Maxie Better.   Has not completed her first colonoscopy yet.    Mammogram July 2024 through [facility] per pt - will obtain records, last  completed on 08/05/2020 with no mammographic evidence of malignancy.     Vaccine Counseling: Due for Tdap and she may have had Tdap through employee health at [facility].  She declined Covid vaccine.  Flu vaccine given through employment. 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 Richarda Overlie, Radiologist and has annual follow-up with Dr. Janey Greaser.  She had MR pelvis in March by Dr. Radford Pax to follow-up on uterine artery embolization/uterine fibroids.  Noted was decreased  volume and size of individual fibroids.  No new enlarging uterine masses  identified.  Persistent 1 cm submucosal or intracavitary fibroid noted in the uterine fundus unchanged.  Normal appearance of both ovaries.   Uterine fibroids led to menorrhagia and iron deficiency anemia.  Hemoglobin was 6.7 g in September 2021.  Previously saw Dr. Cyndie Chime in 2018 for iron deficiency anemia not responding to oral iron and received IV iron injections for time. She was initially seen here for the first time in July 2018 with history of iron deficiency anemia. No longer having menorrhagia, shortness of breath or palpitations.   She has a history of constipation and internal hemorrhoids while on oral iron treatment.  History of C-section.  History of PID treated in 2011.    No known drug allergies.  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  She has a high school education and is single.  She does not smoke or consume alcohol.  She has 2 sons, ages 76 and 31.   03/03/22 - one cup of coffee daily. Exercises by walking around her clinic at work.       Family  history: Father in good health.  Mother with history of hypertension and rheumatoid arthritis.  1 sister in her mid 33s in good health.  Maternal grandfather with history of pneumonia.  Paternal grandmother with history of dementia.      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  Review of Systems  Constitutional:  Negative for chills, fever, malaise/fatigue and weight loss.  HENT:  Negative for hearing loss, sinus pain and sore throat.   Respiratory:  Negative for cough, hemoptysis and  shortness of breath.   Cardiovascular:  Positive for palpitations (recent, short, attributed to espresso) and leg swelling (trace). Negative for chest pain and PND.  Gastrointestinal:  Negative for abdominal pain, constipation, diarrhea, heartburn, nausea and vomiting.  Genitourinary:  Negative for dysuria, frequency and urgency.  Musculoskeletal:  Negative for back pain, myalgias and neck pain.  Skin:  Negative for itching and rash.  Neurological:  Negative for dizziness, tingling, seizures and headaches.  Endo/Heme/Allergies:  Negative for polydipsia.  Psychiatric/Behavioral:  Negative for depression. The patient is not nervous/anxious.     Objective:  Vitals: Ht 5\' 6"  (1.676 m)   Wt 275 lb (124.7 kg)   LMP 02/28/2023   BMI 44.39 kg/m  Physical Exam Vitals and nursing note reviewed.  Constitutional:      General: She is not in acute distress.    Appearance: Normal appearance. She is not ill-appearing or toxic-appearing.  HENT:     Head: Normocephalic and atraumatic.     Right Ear: Hearing, tympanic membrane, ear canal and external ear normal.     Left Ear: Hearing, tympanic membrane, ear canal and external ear normal.     Mouth/Throat:     Pharynx: Oropharynx is clear.  Eyes:     Extraocular Movements: Extraocular movements intact.     Pupils: Pupils are equal, round, and reactive to light.  Neck:     Thyroid: No thyroid mass, thyromegaly or thyroid tenderness.     Vascular: No carotid bruit.  Cardiovascular:     Rate and Rhythm: Normal rate and regular rhythm. No extrasystoles are present.    Pulses:          Dorsalis pedis pulses are 1+ on the right side and 1+ on the left side.     Heart sounds: Normal heart sounds. No murmur heard.    No friction rub. No gallop.  Pulmonary:     Effort: Pulmonary effort is normal.     Breath sounds: Normal breath sounds. No decreased breath sounds, wheezing, rhonchi or rales.  Chest:     Chest wall: No mass.  Abdominal:      Palpations: Abdomen is soft. There is no hepatomegaly, splenomegaly or mass.     Tenderness: There is no abdominal tenderness.     Hernia: No hernia is present.  Musculoskeletal:     Cervical back: Normal range of motion.     Right lower leg: Edema (trace) present.     Left lower leg: Edema (trace) present.  Lymphadenopathy:     Cervical: No cervical adenopathy.     Upper Body:     Right upper body: No supraclavicular adenopathy.     Left upper body: No supraclavicular adenopathy.  Skin:    General: Skin is warm and dry.  Neurological:     General: No focal deficit present.     Mental Status: She is alert and oriented to person, place, and time. Mental status is at baseline.  Sensory: Sensation is intact.     Motor: Motor function is intact. No weakness.     Deep Tendon Reflexes: Reflexes are normal and symmetric.  Psychiatric:        Attention and Perception: Attention normal.        Mood and Affect: Mood normal.        Speech: Speech normal.        Behavior: Behavior normal.        Thought Content: Thought content normal.        Cognition and Memory: Cognition normal.        Judgment: Judgment normal.   Most Recent Fall Risk Assessment:    03/05/2022    2:59 PM  Fall Risk   Falls in the past year? 0  Number falls in past yr: 0  Injury with Fall? 0  Risk for fall due to : No Fall Risks  Follow up Falls prevention discussed   Most Recent Depression Screenings:    03/05/2022    2:59 PM 02/27/2021    3:05 PM  PHQ 2/9 Scores  PHQ - 2 Score 0 0   Results:  Studies obtained and personally reviewed by me:  Mammogram 08/05/2020 with no mammographic evidence of malignancy.   Labs:     Component Value Date/Time   NA 137 03/07/2023 0911   K 4.4 03/07/2023 0911   CL 105 03/07/2023 0911   CO2 25 03/07/2023 0911   GLUCOSE 83 03/07/2023 0911   BUN 11 03/07/2023 0911   CREATININE 0.71 03/07/2023 0911   CALCIUM 9.2 03/07/2023 0911   PROT 6.4 03/07/2023 0911   ALBUMIN  3.7 02/23/2019 0828   AST 12 03/07/2023 0911   AST 14 (L) 02/23/2019 0828   ALT 9 03/07/2023 0911   ALT 11 02/23/2019 0828   ALKPHOS 50 02/23/2019 0828   BILITOT 0.5 03/07/2023 0911   BILITOT 0.5 02/23/2019 0828   GFRNONAA 106 10/18/2019 0943   GFRAA 123 10/18/2019 0943    Lab Results  Component Value Date   WBC 7.2 03/07/2023   HGB 13.2 03/07/2023   HCT 40.1 03/07/2023   MCV 86.8 03/07/2023   PLT 305 03/07/2023   Lab Results  Component Value Date   CHOL 176 03/07/2023   HDL 64 03/07/2023   LDLCALC 98 03/07/2023   TRIG 55 03/07/2023   CHOLHDL 2.8 03/07/2023   Lab Results  Component Value Date   TSH 1.94 03/07/2023    Assessment & Plan:   Orders Placed This Encounter  Procedures   POCT URINALYSIS DIP (CLINITEK)  Other Labs Reviewed today: CBC: WNL CMP: WNL Lipid Panel: WNL TSH: 1.94  Vitamin D Deficiency treated with Drisdol 1.25 mg once weekly. Hasn't been taking for a couple of months - instructed to restart. Refilled today.   Pap Smear July 2024 with her gynecologist, Dr. Maxie Better, in June 2024. Will obtain records.    Has not completed her first Colonoscopy yet.   History of iron deficiency due to uterine fibroids status post uterine artery embolization.  Followed by GYN.  Hemoglobin is normal.  MCV is normal.I, Margaree Mackintosh, MD, have reviewed all documentation for this visit. The documentation on 03/11/23 for the exam, diagnosis, procedures, and orders are all accurate and complete.   Mammogram July 2024 through [facility] per pt - will obtain records, last completed in records on 08/05/2020 with no mammographic evidence of malignancy.   Bone Density won't be due until 2042.   Vaccine Counseling: Due  for Tdap, Covid-19, Flu - discussed, she may have had Tdap or Flu at [facility], declined [facility] wellness visit done today including the all of the following: Reviewed patient's Family Medical History Reviewed and updated list of patient's  medical providers Assessment of cognitive impairment was done Assessed patient's functional ability Established a written schedule for health screening services Health Risk Assessent Completed and Reviewed  Discussed health benefits of physical activity, and encouraged her to engage in regular exercise appropriate for her age and condition.    I,Emily Lagle,acting as a Neurosurgeon for Margaree Mackintosh, MD.,have documented all relevant documentation on the behalf of Margaree Mackintosh, MD,as directed by  Margaree Mackintosh, MD while in the presence of Margaree Mackintosh, MD.   I, Margaree Mackintosh, MD, have reviewed all documentation for this visit. The documentation on 03/11/23 for the exam, diagnosis, procedures, and orders are all accurate and complete.

## 2023-03-11 NOTE — Patient Instructions (Addendum)
As always, it was a pleasure to see you today.  Your labs are stable.  Vaccines discussed.  Please continue vitamin D supplement and iron supplement.  Please have colonoscopy in the near future and annual mammogram.  Return in 1 year or as needed.

## 2023-03-14 ENCOUNTER — Encounter: Payer: Self-pay | Admitting: Internal Medicine

## 2023-03-15 ENCOUNTER — Encounter: Payer: Self-pay | Admitting: Internal Medicine

## 2023-03-23 ENCOUNTER — Ambulatory Visit: Payer: Commercial Managed Care - PPO | Admitting: Dermatology

## 2023-03-23 ENCOUNTER — Encounter: Payer: Self-pay | Admitting: Dermatology

## 2023-03-23 ENCOUNTER — Other Ambulatory Visit (HOSPITAL_COMMUNITY): Payer: Self-pay

## 2023-03-23 DIAGNOSIS — K5903 Drug induced constipation: Secondary | ICD-10-CM

## 2023-03-23 DIAGNOSIS — T452X5A Adverse effect of vitamins, initial encounter: Secondary | ICD-10-CM

## 2023-03-23 DIAGNOSIS — L6681 Central centrifugal cicatricial alopecia: Secondary | ICD-10-CM | POA: Diagnosis not present

## 2023-03-23 MED ORDER — SAFETY SEAL MISCELLANEOUS MISC: 1.0000 | Freq: Every morning | 5 refills | Status: DC

## 2023-03-23 NOTE — Progress Notes (Signed)
   Follow-Up Visit   Subjective  Lori Lara is a 48 y.o. female who presents for the following: CCCA  Patient present today for follow up visit. Patient was last evaluated on 10/18/22. At this visit patient was prescribed AA Gel. Patient reports sxs are better. Patient denies medication changes.  The following portions of the chart were reviewed this encounter and updated as appropriate: medications, allergies, medical history  Review of Systems:  No other skin or systemic complaints except as noted in HPI or Assessment and Plan.  Objective  Well appearing patient in no apparent distress; mood and affect are within normal limits.   A focused examination was performed of the following areas: scalp   Relevant exam findings are noted in the Assessment and Plan.           Assessment & Plan   CCCA (scarring alopecia) Assessment: Patient is being treated for alopecia with topical medication from [facility] and oral supplements. Improvement noted with thickening of hair between braids and in the back of the scalp over the past 3 months. Progress is slow but steady, with the patient requiring photographic evidence to appreciate changes.   Plan:   Continue daily application of topical medication from [facility] in the mornings.   Consider switching from Viviscal to Nutrafol (2 tablets daily) for cost-effectiveness.   Continue collagen supplementation.   Maintain a healthy diet rich in green leafy vegetables and blueberries.   Schedule a follow-up appointment in 6 months.   Provide a new prescription for topical medication with 5 refills.  Constipation Assessment: Patient reports constipation as a side effect of Viviscal, leading to a reduction in the frequency of supplement intake to every other day.  Plan:   Consider switching to an alternative supplement regimen (e.g., Nutrafol) to potentially alleviate constipation while maintaining hair growth benefits.   Monitor for  improvement in constipation with any changes in supplement regimen..    No follow-ups on file.    Documentation: I have reviewed the above documentation for accuracy and completeness, and I agree with the above.   I, Shirron Marcha Solders, CMA, am acting as scribe for [employer], DO.   Langston Reusing, DO

## 2023-03-23 NOTE — Patient Instructions (Addendum)
Hello Lori Lara,  Thank you for visiting my office today. Your dedication to improving your health is commendable, and it's a pleasure to witness the progress you're making. Here is a summary of the key instructions and recommendations from today's consultation:  [facility] Topical Treatment: Continue applying the [facility] topical treatment every morning.  Viviscal Intake: Adjust your intake of Viviscal to every other day if you experience constipation with daily use.  Nutrafol Adjustment: Consider switching to Nutrafol, taking two tablets a day instead of four. This adjustment aims to make your treatment more cost-effective and potentially easier on your system.  Dietary Recommendations: Maintain a healthy diet that includes green leafy vegetables and blueberries to naturally combat inflammation.  Collagen Supplementation: Ensure you are taking collagen as discussed during our consultation.  Prescription Refill: A new prescription for the topical treatment, with 5 refills, will be sent to your pharmacy to ensure you have enough for continued use.  Follow-Up: We will schedule a follow-up appointment in 6 months to further monitor your progress. I am confident that with these adjustments, you will continue to see improvements in your health.  Warm regards,  Dr. [facility]  Important Information  Due to recent changes in healthcare laws, you may see results of your pathology and/or laboratory studies on MyChart before the doctors have had a chance to review them. We understand that in some cases there may be results that are confusing or concerning to you. Please understand that not all results are received at the same time and often the doctors may need to interpret multiple results in order to provide you with the best plan of care or course of treatment. Therefore, we ask that you please give [place] 2 business days to thoroughly review all your results before contacting the office  for clarification. Should we see a critical lab result, you will be contacted sooner.   If You Need Anything After Your Visit  If you have any questions or concerns for your doctor, please call our main line at [phone] If no one answers, please leave a voicemail as directed and we will return your call as soon as possible. Messages left after 4 pm will be answered the following business day.   You may also send [place] a message via MyChart. We typically respond to MyChart messages within 1-2 business days.  For prescription refills, please ask your pharmacy to contact our office. Our fax number is [phone].  If you have an urgent issue when the clinic is closed that cannot wait until the next business day, you can page your doctor at the number below.    Please note that while we do our best to be available for urgent issues outside of office hours, we are not available 24/7.   If you have an urgent issue and are unable to reach [place], you may choose to seek medical care at your doctor's office, retail clinic, urgent care center, or emergency room.  If you have a medical emergency, please immediately call 911 or go to the emergency department. In the event of inclement weather, please call our main line at [phone] for an update on the status of any delays or closures.  Dermatology Medication Tips: Please keep the boxes that topical medications come in in order to help keep track of the instructions about where and how to use these. Pharmacies typically print the medication instructions only on the boxes and not directly on the medication tubes.   If your medication  is too expensive, please contact our office at [phone] or send [place] a message through MyChart.   We are unable to tell what your co-pay for medications will be in advance as this is different depending on your insurance coverage. However, we may be able to find a substitute medication at lower cost or fill out paperwork  to get insurance to cover a needed medication.   If a prior authorization is required to get your medication covered by your insurance company, please allow [place] 1-2 business days to complete this process.  Drug prices often vary depending on where the prescription is filled and some pharmacies may offer cheaper prices.  The website www.goodrx.com contains coupons for medications through different pharmacies. The prices here do not account for what the cost may be with help from insurance (it may be cheaper with your insurance), but the website can give you the price if you did not use any insurance.  - You can print the associated coupon and take it with your prescription to the pharmacy.  - You may also stop by our office during regular business hours and pick up a GoodRx coupon card.  - If you need your prescription sent electronically to a different pharmacy, notify our office through [facility] or by phone at [phone]

## 2023-08-29 ENCOUNTER — Other Ambulatory Visit: Payer: Self-pay

## 2023-09-02 ENCOUNTER — Other Ambulatory Visit: Payer: Self-pay

## 2023-09-20 ENCOUNTER — Ambulatory Visit: Payer: Commercial Managed Care - PPO | Admitting: Dermatology

## 2023-10-07 DIAGNOSIS — Z1231 Encounter for screening mammogram for malignant neoplasm of breast: Secondary | ICD-10-CM | POA: Diagnosis not present

## 2023-11-28 ENCOUNTER — Encounter: Payer: Self-pay | Admitting: Dermatology

## 2023-12-01 ENCOUNTER — Ambulatory Visit: Admitting: Dermatology

## 2023-12-01 ENCOUNTER — Encounter: Payer: Self-pay | Admitting: Dermatology

## 2023-12-01 VITALS — BP 122/82 | HR 81

## 2023-12-01 DIAGNOSIS — Z01419 Encounter for gynecological examination (general) (routine) without abnormal findings: Secondary | ICD-10-CM | POA: Diagnosis not present

## 2023-12-01 DIAGNOSIS — L6681 Central centrifugal cicatricial alopecia: Secondary | ICD-10-CM | POA: Diagnosis not present

## 2023-12-01 DIAGNOSIS — B079 Viral wart, unspecified: Secondary | ICD-10-CM | POA: Diagnosis not present

## 2023-12-01 MED ORDER — SAFETY SEAL MISCELLANEOUS MISC: 1.0000 | Freq: Every morning | 9 refills | Status: AC

## 2023-12-01 NOTE — Progress Notes (Signed)
   Follow-Up Visit   Subjective  Lori Lara is a 48 y.o. female who presents for the following: CCCA & Spot Check  Patient present today for follow up visit for CCCA & Spot Check. Patient was last evaluated on 03/23/23. At this visit patient was advised to continue AA gel, Nutrofol and collagen. Patient reports sxs are improving. Patient denies medication changes.  Patient states she has spot located at the face that she would like to have examined. Patient reports the areas have been there for 1 month. She reports the areas are not bothersome. She states that the areas has changed since it first appeared. Patient reports she has not previously been treated for these areas.  The following portions of the chart were reviewed this encounter and updated as appropriate: medications, allergies, medical history  Review of Systems:  No other skin or systemic complaints except as noted in HPI or Assessment and Plan.  Objective  Well appearing patient in no apparent distress; mood and affect are within normal limits.  A focused examination was performed of the following areas: Scalp and face  Relevant exam findings are noted in the Assessment and Plan.              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 Exam: Scarring alopecia characterized by patches of permanent hair loss that manifest on the vertex or crown of the scalp, progressively spreading outward in a centrifugal pattern  Treatment Plan: - Switch from AA Gel to Hormonic Hair Solution (Minoxidil 8% Finasteride 0.05% Clobetasol 0.05% and Metformin 8%), rx sent to [facility] - Recommended continuing Nutrafol and Vital Protein - If topicals are ineffective plan to have ILK Injections - Plan to follow up in 6 Months  FILIFORM WART Exam: verrucous papule(s)  Counseling Discussed viral / HPV (Human Papilloma Virus)  etiology and risk of spread /infectivity to other areas of body as well as to other people.  Multiple treatments and methods may be required to clear warts and it is possible treatment may not be successful.  Treatment risks include discoloration; scarring and there is still potential for wart recurrence.  Treatment Plan: - Cryo Therapy Completed while in office today  CENTRAL CENTRIFUGAL SCARRING ALOPECIA   FILIFORM WART Right Malar Cheek Destruction of lesion - Right Malar Cheek Complexity: simple   Destruction method: cryotherapy   Informed consent: discussed and consent obtained   Timeout:  patient name, date of birth, surgical site, and procedure verified Lesion destroyed using liquid nitrogen: Yes   Cryotherapy cycles:  1 Post-procedure details: wound care instructions given     Return in about 6 months (around 05/31/2024) for CCCA F/U.  I, Jetta Ager, am acting as Neurosurgeon for [employer], DO.  Documentation: I have reviewed the above documentation for accuracy and completeness, and I agree with the above.  Delon Lenis, DO

## 2023-12-01 NOTE — Patient Instructions (Addendum)
 VISIT SUMMARY:  During your visit, we discussed the progress of your hair thinning treatment and addressed a new skin concern. We reviewed your current regimen and made some adjustments to improve your treatment outcomes.  YOUR PLAN:  -ANDROGENETIC ALOPECIA WITH SCARRING COMPONENT:  Androgenetic alopecia is a common form of hair loss often influenced by genetic and hormonal factors, and in your case, it includes a scarring component.  We will add oral finasteride to help with hormonal hair thinning and topical metformin to address scarring alopecia. Continue using your current topical minoxidil and clobetasol. Please finish your current bottle of medication before switching to the new prescription. If there is no improvement in six months, we may consider Kenalog injections.  -FILIFORM WART OF FACE:  A filiform wart is a small, benign growth caused by a viral infection, characterized by its thread-like projections. We performed cryotherapy with liquid nitrogen on the wart. Apply Aquaphor to the treated area every morning and night to aid healing.  INSTRUCTIONS:  Please follow up in six months to assess the progress of your hair thinning treatment. If you notice any new skin lesions or changes, contact our office immediately.    Cryotherapy Aftercare  Wash gently with soap and water everyday.   Apply Vaseline and Band-Aid daily until healed.   Important Information   Due to recent changes in healthcare laws, you may see results of your pathology and/or laboratory studies on MyChart before the doctors have had a chance to review them. We understand that in some cases there may be results that are confusing or concerning to you. Please understand that not all results are received at the same time and often the doctors may need to interpret multiple results in order to provide you with the best plan of care or course of treatment. Therefore, we ask that you please give us  2 business days to  thoroughly review all your results before contacting the office for clarification. Should we see a critical lab result, you will be contacted sooner.     If You Need Anything After Your Visit   If you have any questions or concerns for your doctor, please call our main line at [phone]. If no one answers, please leave a voicemail as directed and we will return your call as soon as possible. Messages left after 4 pm will be answered the following business day.    You may also send us  a message via MyChart. We typically respond to MyChart messages within 1-2 business days.  For prescription refills, please ask your pharmacy to contact our office. Our fax number is [phone].  If you have an urgent issue when the clinic is closed that cannot wait until the next business day, you can page your doctor at the number below.     Please note that while we do our best to be available for urgent issues outside of office hours, we are not available 24/7.    If you have an urgent issue and are unable to reach us , you may choose to seek medical care at your doctor's office, retail clinic, urgent care center, or emergency room.   If you have a medical emergency, please immediately call 911 or go to the emergency department. In the event of inclement weather, please call our main line at [phone] for an update on the status of any delays or closures.  Dermatology Medication Tips: Please keep the boxes that topical medications come in in order to help keep track of  the instructions about where and how to use these. Pharmacies typically print the medication instructions only on the boxes and not directly on the medication tubes.   If your medication is too expensive, please contact our office at [phone] or send us  a message through MyChart.    We are unable to tell what your co-pay for medications will be in advance as this is different depending on your insurance coverage. However, we may be  able to find a substitute medication at lower cost or fill out paperwork to get insurance to cover a needed medication.    If a prior authorization is required to get your medication covered by your insurance company, please allow us  1-2 business days to complete this process.   Drug prices often vary depending on where the prescription is filled and some pharmacies may offer cheaper prices.   The website www.goodrx.com contains coupons for medications through different pharmacies. The prices here do not account for what the cost may be with help from insurance (it may be cheaper with your insurance), but the website can give you the price if you did not use any insurance.  - You can print the associated coupon and take it with your prescription to the pharmacy.  - You may also stop by our office during regular business hours and pick up a GoodRx coupon card.  - If you need your prescription sent electronically to a different pharmacy, notify our office through [facility] or by phone at [phone]

## 2023-12-02 ENCOUNTER — Other Ambulatory Visit: Payer: Self-pay

## 2023-12-05 LAB — HM PAP SMEAR: HM Pap smear: NEGATIVE

## 2024-03-02 NOTE — Progress Notes (Shared)
 "  Annual Comprehensive Physical Exam   Patient Care Team: Perri Ronal PARAS, MD as PCP - General (Internal Medicine)  Visit Date: 03/16/24   Chief Complaint  Patient presents with   Annual Exam   Subjective:  Patient: Lori Lara, Female DOB: [birth year], 49 y.o. MRN: [id number] Vitals:   03/16/24 1445  BP: 110/80   Jecenia Shamira Toutant is a 49 y.o. Female who presents today for her Annual Comprehensive Physical Exam. Patient has Iron  deficiency anemia; Uterine leiomyoma; and Menorrhagia on their problem list.  History of Vitamin D  Deficiency treated with Drisdol  1.25 mg once weekly. Has been taking it for the last few months.   Labs 03/15/2024 LDL 103, Otherwise WNL.    10/01/2022 Mammogram No mammographic evidence of malignancy. Repeat in one year.    Vaccine counseling: Tetanus vaccine due.    Health Maintenance: Colonoscopy due.   Health Maintenance  Topic Date Due   DTaP/Tdap/Td (1 - Tdap) Never done   Hepatitis B Vaccines 19-59 Average Risk (1 of 3 - 19+ 3-dose series) Never done   Colonoscopy  Never done   Influenza Vaccine  09/09/2023   Mammogram  10/01/2023   COVID-19 Vaccine (3 - 2025-26 season) 10/10/2023   Cervical Cancer Screening (HPV/Pap Cotest)  12/05/2026   HPV VACCINES (No Doses Required) Completed   Pneumococcal Vaccine  Aged Out   Meningococcal B Vaccine  Aged Out   Hepatitis C Screening  Discontinued   HIV Screening  Discontinued      Review of Systems  Constitutional:  Negative for fever and malaise/fatigue.  HENT:  Negative for congestion.   Eyes:  Negative for blurred vision.  Respiratory:  Negative for cough and shortness of breath.   Cardiovascular:  Negative for chest pain, palpitations and leg swelling.  Gastrointestinal:  Negative for vomiting.  Musculoskeletal:  Negative for back pain.  Skin:  Negative for rash.  Neurological:  Negative for loss of consciousness and headaches.   Objective:  Vitals: body mass index is  45.19 kg/m. Today's Vitals   03/16/24 1445  BP: 110/80  Pulse: 67  SpO2: 97%  Weight: 280 lb (127 kg)  Height: 5' 6 (1.676 m)  PainSc: 0-No pain   Physical Exam Vitals and nursing note reviewed.  Constitutional:      General: She is not in acute distress.    Appearance: Normal appearance. She is not ill-appearing or toxic-appearing.  HENT:     Head: Normocephalic and atraumatic.     Right Ear: Hearing, tympanic membrane, ear canal and external ear normal.     Left Ear: Hearing, tympanic membrane, ear canal and external ear normal.     Mouth/Throat:     Pharynx: Oropharynx is clear.  Eyes:     Extraocular Movements: Extraocular movements intact.     Pupils: Pupils are equal, round, and reactive to light.  Neck:     Thyroid : No thyroid  mass, thyromegaly or thyroid  tenderness.     Vascular: No carotid bruit.  Cardiovascular:     Rate and Rhythm: Normal rate and regular rhythm. No extrasystoles are present.    Pulses:          Dorsalis pedis pulses are 2+ on the right side and 2+ on the left side.     Heart sounds: Normal heart sounds. No murmur heard.    No friction rub. No gallop.  Pulmonary:     Effort: Pulmonary effort is normal.     Breath sounds: Normal breath sounds. No  decreased breath sounds, wheezing, rhonchi or rales.  Chest:     Chest wall: No mass.  Abdominal:     Palpations: Abdomen is soft. There is no hepatomegaly, splenomegaly or mass.     Tenderness: There is no abdominal tenderness.     Hernia: No hernia is present.  Genitourinary:    Comments: GYN deferred.  Musculoskeletal:     Cervical back: Normal range of motion.     Right lower leg: No edema.     Left lower leg: No edema.  Lymphadenopathy:     Cervical: No cervical adenopathy.     Upper Body:     Right upper body: No supraclavicular adenopathy.     Left upper body: No supraclavicular adenopathy.  Skin:    General: Skin is warm and dry.  Neurological:     General: No focal deficit present.      Mental Status: She is alert and oriented to person, place, and time. Mental status is at baseline.     Sensory: Sensation is intact.     Motor: Motor function is intact. No weakness.     Deep Tendon Reflexes: Reflexes are normal and symmetric.  Psychiatric:        Attention and Perception: Attention normal.        Mood and Affect: Mood normal.        Speech: Speech normal.        Behavior: Behavior normal.        Thought Content: Thought content normal.        Cognition and Memory: Cognition normal.        Judgment: Judgment normal.     Current Outpatient Medications  Medication Instructions   Emollient (COLLAGEN) CREA Apply topically.   ergocalciferol  (DRISDOL ) 50,000 Units, Oral, Weekly   hydrocortisone  2.5 % cream Topical, 2 times daily   NON FORMULARY No dose, route, or frequency recorded.   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 Juliene Balder, Radiologist and has annual follow-up with Dr. Rutherford POLING.  She had MR pelvis in March by Dr. Mickiel to follow-up on uterine artery embolization/uterine fibroids.  Noted was decreased  volume and size of individual fibroids.  No new enlarging uterine masses identified.  Persistent 1 cm submucosal or intracavitary fibroid noted in the uterine fundus unchanged.  Normal appearance of both ovaries.    Uterine fibroids led to menorrhagia and iron  deficiency anemia.  Hemoglobin was 6.7 g in September 2021.  Previously saw Dr. Freddie in 2018 for iron  deficiency anemia not responding to oral iron  and received  IV iron  injections for time. She was initially seen here for the first time in July 2018 with history of iron  deficiency anemia. No longer having menorrhagia, shortness of breath or palpitations.   She has a history of constipation and internal hemorrhoids while on oral iron  treatment.  History of C-section.  History of PID treated in 2011. Past Surgical History:  Procedure Laterality Date   IR ANGIOGRAM PELVIS SELECTIVE OR SUPRASELECTIVE  10/03/2019   IR ANGIOGRAM SELECTIVE EACH ADDITIONAL VESSEL  10/03/2019   IR ANGIOGRAM  SELECTIVE EACH ADDITIONAL VESSEL  10/03/2019   IR EMBO TUMOR ORGAN ISCHEMIA INFARCT INC GUIDE ROADMAPPING  10/03/2019   IR RADIOLOGIST EVAL & MGMT  07/11/2019   IR RADIOLOGIST EVAL & MGMT  08/21/2019   IR RADIOLOGIST EVAL & MGMT  11/06/2019   IR RADIOLOGIST EVAL & MGMT  04/10/2020   IR US  GUIDE VASC ACCESS RIGHT  10/03/2019   Family History  Problem Relation Age of Onset   Hypertension Mother    Arthritis/Rheumatoid Mother    Arthritis Mother    Breast cancer Paternal Aunt    Cancer Paternal Aunt    Colon cancer Paternal Aunt    Stomach cancer Paternal Uncle    Pancreatic cancer Paternal Uncle    Cancer Paternal Uncle    Dementia Paternal Grandmother    Diabetes Maternal Grandfather    Family History Narrative:  Social History   Social History Narrative   Social history: 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  She has a high school education and is single.  She does not smoke or consume alcohol.  She has 2 sons, ages 48 and 39.   03/03/22 - one cup of coffee daily. Exercises by walking around her clinic at work.       Family history: Father in good health.  Mother with history of hypertension and rheumatoid arthritis.  1 sister in her mid 67s in good health.  Maternal grandfather with history of pneumonia.  Paternal grandmother with history of dementia.       Most Recent Health Risks Assessment:   Most Recent  Social Determinants of Health (Including Hx of Tobacco, Alcohol, and Drug Use) SDOH Screenings   Food Insecurity: No Food Insecurity (03/15/2024)  Housing: Low Risk (03/15/2024)  Transportation Needs: No Transportation Needs (03/15/2024)  Alcohol Screen: Low Risk (03/11/2023)  Depression (PHQ2-9): Low Risk (03/11/2023)  Financial Resource Strain: Low Risk (03/15/2024)  Physical Activity: Inactive (03/15/2024)  Social Connections: Socially Integrated (03/15/2024)  Stress: No Stress Concern Present (03/15/2024)  Tobacco Use: Low Risk (03/16/2024)   Social History[2]  Most Recent Fall Risk Assessment:    03/05/2022    2:59 PM  Fall Risk   Falls in the past year? 0  Number falls in past yr: 0  Injury with Fall? 0   Risk for fall due to : No Fall Risks  Follow up Falls prevention discussed     Data saved with a previous flowsheet row definition   Most Recent Anxiety/Depression Screenings:    03/11/2023    3:16 PM 03/05/2022    2:59 PM  PHQ 2/9 Scores  PHQ - 2 Score 0 0   Results:  Studies Obtained And Personally Reviewed By Me:  10/01/2022 Mammogram No mammographic evidence of malignancy. Repeat in one year.    Labs:  CBC w/ Differential Lab Results  Component Value Date   WBC 6.5 03/15/2024   RBC 4.35 03/15/2024   HGB 12.5 03/15/2024   HCT 37.4 03/15/2024   PLT 306 03/15/2024   MCV 86.0 03/15/2024   MCH 28.7 03/15/2024   MCHC 33.4 03/15/2024   RDW 13.4 03/15/2024   MPV 10.4 03/15/2024   LYMPHSABS 1,735 03/01/2022   MONOABS 0.5 06/26/2020   BASOSABS 72 03/15/2024    Comprehensive Metabolic Panel Lab Results  Component Value Date   NA 141 03/15/2024   K 4.4 03/15/2024   CL 106 03/15/2024   CO2 27 03/15/2024   GLUCOSE 83 03/15/2024  BUN 12 03/15/2024   CREATININE 0.65 03/15/2024   CALCIUM 9.3 03/15/2024   PROT 6.7 03/15/2024   ALBUMIN 3.7 02/23/2019   AST 12 03/15/2024   ALT 10 03/15/2024   ALKPHOS 50 02/23/2019   BILITOT 0.6 03/15/2024   EGFR 109 03/15/2024    GFRNONAA 106 10/18/2019   Lipid Panel  Lab Results  Component Value Date   CHOL 176 03/15/2024   HDL 59 03/15/2024   LDLCALC 103 (H) 03/15/2024   TRIG 49 03/15/2024   TSH Lab Results  Component Value Date   TSH 1.74 03/15/2024    Results for orders placed or performed in visit on 03/16/24  HM PAP SMEAR  Result Value Ref Range   HM Pap smear Negative    Assessment & Plan:   Orders Placed This Encounter  Procedures   Ambulatory referral to Gastroenterology    Referral Priority:   Routine    Referral Type:   Consultation    Referral Reason:   Specialty Services Required    Referred to Provider:   Avram Lupita BRAVO, MD    Number of Visits Requested:   1   Meds ordered this encounter  Medications   ergocalciferol  (DRISDOL ) 1.25 MG (50000 UT) capsule    Sig: Take 1 capsule (50,000 Units total) by mouth once a week.    Dispense:  12 capsule    Refill:  3   Vitamin D  Deficiency: treated with Drisdol  1.25 mg once weekly. Has been taking it for the last few months.    10/01/2022 Mammogram No mammographic evidence of malignancy. Repeat in one year.    Vaccine counseling: Tetanus vaccine due.   Health maintenance: Colonoscopy due   Referred to gastroenterology.    Annual Comprehensive Physical Exam done today including the all of the following: Reviewed patient's Family Medical History Reviewed patient's SDOH and reviewed tobacco, alcohol, and drug use.  Reviewed and updated list of patient's medical providers Assessment of cognitive impairment was done Assessed patient's functional ability Established a written schedule for health screening services Health Risk Assessent Completed and Reviewed  Discussed health benefits of physical activity, and encouraged her to engage in regular exercise appropriate for her age and condition.   I,Makayla C Reid,acting as a scribe for Ronal JINNY Hailstone, MD.,have documented all relevant documentation on the behalf of Ronal JINNY Hailstone, MD,as  directed by  Ronal JINNY Hailstone, MD while in the presence of Ronal JINNY Hailstone, MD.  I, Ronal JINNY Hailstone, MD, have reviewed all documentation for and agree with the above Annual Wellness Visit documentation.  Ronal JINNY Hailstone, MD Internal Medicine 03/16/2024     [1] No Known Allergies [2]  Social History Tobacco Use   Smoking status: Never    Passive exposure: Never   Smokeless tobacco: Never  Substance Use Topics   Alcohol use: Never   Drug use: Never   "

## 2024-03-08 ENCOUNTER — Telehealth: Payer: Self-pay | Admitting: Internal Medicine

## 2024-03-08 NOTE — Telephone Encounter (Signed)
 Done

## 2024-03-09 ENCOUNTER — Telehealth: Payer: Self-pay | Admitting: Internal Medicine

## 2024-03-09 NOTE — Telephone Encounter (Signed)
 Done

## 2024-03-12 ENCOUNTER — Other Ambulatory Visit: Payer: Commercial Managed Care - PPO

## 2024-03-12 DIAGNOSIS — Z13220 Encounter for screening for lipoid disorders: Secondary | ICD-10-CM

## 2024-03-12 DIAGNOSIS — Z0000 Encounter for general adult medical examination without abnormal findings: Secondary | ICD-10-CM

## 2024-03-12 DIAGNOSIS — Z1329 Encounter for screening for other suspected endocrine disorder: Secondary | ICD-10-CM

## 2024-03-13 ENCOUNTER — Encounter: Payer: Self-pay | Admitting: Internal Medicine

## 2024-03-15 ENCOUNTER — Other Ambulatory Visit

## 2024-03-15 DIAGNOSIS — Z13220 Encounter for screening for lipoid disorders: Secondary | ICD-10-CM

## 2024-03-15 DIAGNOSIS — Z1329 Encounter for screening for other suspected endocrine disorder: Secondary | ICD-10-CM

## 2024-03-15 DIAGNOSIS — Z0000 Encounter for general adult medical examination without abnormal findings: Secondary | ICD-10-CM

## 2024-03-16 ENCOUNTER — Encounter: Payer: Self-pay | Admitting: Internal Medicine

## 2024-03-16 ENCOUNTER — Ambulatory Visit: Payer: Self-pay | Admitting: Internal Medicine

## 2024-03-16 ENCOUNTER — Other Ambulatory Visit: Payer: Self-pay

## 2024-03-16 ENCOUNTER — Ambulatory Visit: Payer: Commercial Managed Care - PPO | Admitting: Internal Medicine

## 2024-03-16 VITALS — BP 110/80 | HR 67 | Ht 66.0 in | Wt 280.0 lb

## 2024-03-16 DIAGNOSIS — Z1211 Encounter for screening for malignant neoplasm of colon: Secondary | ICD-10-CM

## 2024-03-16 DIAGNOSIS — R634 Abnormal weight loss: Secondary | ICD-10-CM

## 2024-03-16 DIAGNOSIS — Z0000 Encounter for general adult medical examination without abnormal findings: Secondary | ICD-10-CM

## 2024-03-16 LAB — POCT URINALYSIS DIP (CLINITEK)
Bilirubin, UA: NEGATIVE
Blood, UA: NEGATIVE
Glucose, UA: NEGATIVE mg/dL
Ketones, POC UA: NEGATIVE mg/dL
Leukocytes, UA: NEGATIVE
Nitrite, UA: POSITIVE — AB
POC PROTEIN,UA: NEGATIVE
Spec Grav, UA: 1.015
Urobilinogen, UA: 0.2 U/dL
pH, UA: 6.5

## 2024-03-16 LAB — COMPREHENSIVE METABOLIC PANEL WITH GFR
AG Ratio: 1.6 (calc) (ref 1.0–2.5)
ALT: 10 U/L (ref 6–29)
AST: 12 U/L (ref 10–35)
Albumin: 4.1 g/dL (ref 3.6–5.1)
Alkaline phosphatase (APISO): 53 U/L (ref 31–125)
BUN: 12 mg/dL (ref 7–25)
CO2: 27 mmol/L (ref 20–32)
Calcium: 9.3 mg/dL (ref 8.6–10.2)
Chloride: 106 mmol/L (ref 98–110)
Creat: 0.65 mg/dL (ref 0.50–0.99)
Globulin: 2.6 g/dL (ref 1.9–3.7)
Glucose, Bld: 83 mg/dL (ref 65–99)
Potassium: 4.4 mmol/L (ref 3.5–5.3)
Sodium: 141 mmol/L (ref 135–146)
Total Bilirubin: 0.6 mg/dL (ref 0.2–1.2)
Total Protein: 6.7 g/dL (ref 6.1–8.1)
eGFR: 109 mL/min/{1.73_m2}

## 2024-03-16 LAB — CBC WITH DIFFERENTIAL/PLATELET
Absolute Lymphocytes: 1489 {cells}/uL (ref 850–3900)
Absolute Monocytes: 371 {cells}/uL (ref 200–950)
Basophils Absolute: 72 {cells}/uL (ref 0–200)
Basophils Relative: 1.1 %
Eosinophils Absolute: 91 {cells}/uL (ref 15–500)
Eosinophils Relative: 1.4 %
HCT: 37.4 % (ref 35.9–46.0)
Hemoglobin: 12.5 g/dL (ref 11.7–15.5)
MCH: 28.7 pg (ref 27.0–33.0)
MCHC: 33.4 g/dL (ref 31.6–35.4)
MCV: 86 fL (ref 81.4–101.7)
MPV: 10.4 fL (ref 7.5–12.5)
Monocytes Relative: 5.7 %
Neutro Abs: 4479 {cells}/uL (ref 1500–7800)
Neutrophils Relative %: 68.9 %
Platelets: 306 10*3/uL (ref 140–400)
RBC: 4.35 Million/uL (ref 3.80–5.10)
RDW: 13.4 % (ref 11.0–15.0)
Total Lymphocyte: 22.9 %
WBC: 6.5 10*3/uL (ref 3.8–10.8)

## 2024-03-16 LAB — LIPID PANEL
Cholesterol: 176 mg/dL
HDL: 59 mg/dL
LDL Cholesterol (Calc): 103 mg/dL — ABNORMAL HIGH
Non-HDL Cholesterol (Calc): 117 mg/dL
Total CHOL/HDL Ratio: 3 (calc)
Triglycerides: 49 mg/dL

## 2024-03-16 LAB — TSH: TSH: 1.74 m[IU]/L

## 2024-03-16 MED ORDER — ERGOCALCIFEROL 1.25 MG (50000 UT) PO CAPS
50000.0000 [IU] | ORAL_CAPSULE | ORAL | 3 refills | Status: AC
  Filled 2024-03-16: qty 12, 84d supply, fill #0

## 2024-06-07 ENCOUNTER — Ambulatory Visit: Admitting: Dermatology

## 2025-03-18 ENCOUNTER — Other Ambulatory Visit

## 2025-03-25 ENCOUNTER — Other Ambulatory Visit

## 2025-03-29 ENCOUNTER — Encounter: Admitting: Internal Medicine
